# Patient Record
Sex: Male | Born: 1990 | Race: Black or African American | Hispanic: No | Marital: Single | Smoking: Never smoker
Health system: Southern US, Community
[De-identification: ages and names within clinical notes are randomized; demographics above are authoritative.]

## PROBLEM LIST (undated history)

## (undated) DIAGNOSIS — E669 Obesity, unspecified: Secondary | ICD-10-CM

## (undated) DIAGNOSIS — E782 Mixed hyperlipidemia: Secondary | ICD-10-CM

## (undated) DIAGNOSIS — E1100 Type 2 diabetes mellitus with hyperosmolarity without nonketotic hyperglycemic-hyperosmolar coma (NKHHC): Secondary | ICD-10-CM

## (undated) DIAGNOSIS — E781 Pure hyperglyceridemia: Secondary | ICD-10-CM

## (undated) DIAGNOSIS — R030 Elevated blood-pressure reading, without diagnosis of hypertension: Secondary | ICD-10-CM

## (undated) DIAGNOSIS — E66811 Obesity, class 1: Secondary | ICD-10-CM

## (undated) DIAGNOSIS — R7303 Prediabetes: Secondary | ICD-10-CM

## (undated) DIAGNOSIS — R569 Unspecified convulsions: Secondary | ICD-10-CM

## (undated) HISTORY — DX: Obesity, class 1: E66.811

## (undated) HISTORY — DX: Mixed hyperlipidemia: E78.2

## (undated) HISTORY — DX: Elevated blood-pressure reading, without diagnosis of hypertension: R03.0

## (undated) HISTORY — DX: Prediabetes: R73.03

## (undated) HISTORY — DX: Type 2 diabetes mellitus with hyperosmolarity without nonketotic hyperglycemic-hyperosmolar coma (NKHHC): E11.00

## (undated) HISTORY — DX: Pure hyperglyceridemia: E78.1

## (undated) HISTORY — DX: Obesity, unspecified: E66.9

---

## 2015-03-28 ENCOUNTER — Encounter: Payer: Self-pay | Admitting: Family Medicine

## 2015-03-28 ENCOUNTER — Ambulatory Visit (INDEPENDENT_AMBULATORY_CARE_PROVIDER_SITE_OTHER): Payer: 59 | Admitting: Family Medicine

## 2015-03-28 VITALS — BP 128/80 | HR 86 | Temp 98.4°F | Resp 18 | Ht 72.0 in | Wt 259.1 lb

## 2015-03-28 DIAGNOSIS — R7303 Prediabetes: Secondary | ICD-10-CM

## 2015-03-28 DIAGNOSIS — E669 Obesity, unspecified: Secondary | ICD-10-CM

## 2015-03-28 DIAGNOSIS — E781 Pure hyperglyceridemia: Secondary | ICD-10-CM | POA: Insufficient documentation

## 2015-03-28 DIAGNOSIS — R030 Elevated blood-pressure reading, without diagnosis of hypertension: Secondary | ICD-10-CM | POA: Insufficient documentation

## 2015-03-28 HISTORY — DX: Prediabetes: R73.03
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
## 2015-07-22 ENCOUNTER — Emergency Department
Admission: EM | Admit: 2015-07-22 | Discharge: 2015-07-22 | Disposition: A | Discharge status: Home / Self Care | Payer: 59 | Attending: Emergency Medicine | Admitting: Emergency Medicine

## 2015-07-22 ENCOUNTER — Emergency Department: Payer: 59

## 2015-07-22 ENCOUNTER — Encounter: Payer: Self-pay | Admitting: Emergency Medicine

## 2015-07-22 DIAGNOSIS — R569 Unspecified convulsions: Secondary | ICD-10-CM | POA: Diagnosis present

## 2015-07-22 DIAGNOSIS — R4182 Altered mental status, unspecified: Secondary | ICD-10-CM | POA: Diagnosis not present

## 2015-07-22 LAB — URINE DRUG SCREEN, QUALITATIVE (ARMC ONLY)
AMPHETAMINES, UR SCREEN: NOT DETECTED
Barbiturates, Ur Screen: NOT DETECTED
Benzodiazepine, Ur Scrn: NOT DETECTED
COCAINE METABOLITE, UR ~~LOC~~: NOT DETECTED
Cannabinoid 50 Ng, Ur ~~LOC~~: NOT DETECTED
MDMA (ECSTASY) UR SCREEN: NOT DETECTED
METHADONE SCREEN, URINE: NOT DETECTED
OPIATE, UR SCREEN: NOT DETECTED
Phencyclidine (PCP) Ur S: NOT DETECTED
Tricyclic, Ur Screen: NOT DETECTED

## 2015-07-22 LAB — CBC WITH DIFFERENTIAL/PLATELET
Basophils Absolute: 0 10*3/uL (ref 0–0.1)
Basophils Relative: 0 %
Eosinophils Absolute: 0.1 10*3/uL (ref 0–0.7)
Eosinophils Relative: 2 %
HEMATOCRIT: 48.9 % (ref 40.0–52.0)
HEMOGLOBIN: 16.1 g/dL (ref 13.0–18.0)
LYMPHS ABS: 1.6 10*3/uL (ref 1.0–3.6)
LYMPHS PCT: 25 %
MCH: 27.5 pg (ref 26.0–34.0)
MCHC: 32.9 g/dL (ref 32.0–36.0)
MCV: 83.5 fL (ref 80.0–100.0)
Monocytes Absolute: 0.5 10*3/uL (ref 0.2–1.0)
Monocytes Relative: 8 %
NEUTROS ABS: 4.1 10*3/uL (ref 1.4–6.5)
NEUTROS PCT: 65 %
Platelets: 176 10*3/uL (ref 150–440)
RBC: 5.85 MIL/uL (ref 4.40–5.90)
RDW: 13.7 % (ref 11.5–14.5)
WBC: 6.4 10*3/uL (ref 3.8–10.6)

## 2015-07-22 LAB — URINALYSIS COMPLETE WITH MICROSCOPIC (ARMC ONLY)
BILIRUBIN URINE: NEGATIVE
GLUCOSE, UA: NEGATIVE mg/dL
Hgb urine dipstick: NEGATIVE
Ketones, ur: NEGATIVE mg/dL
Leukocytes, UA: NEGATIVE
Nitrite: NEGATIVE
Protein, ur: 30 mg/dL — AB
Specific Gravity, Urine: 1.016 (ref 1.005–1.030)
pH: 5 (ref 5.0–8.0)

## 2015-07-22 LAB — COMPREHENSIVE METABOLIC PANEL
ALK PHOS: 63 U/L (ref 38–126)
ALT: 45 U/L (ref 17–63)
AST: 32 U/L (ref 15–41)
Albumin: 4.5 g/dL (ref 3.5–5.0)
Anion gap: 7 (ref 5–15)
BUN: 15 mg/dL (ref 6–20)
CALCIUM: 9.9 mg/dL (ref 8.9–10.3)
CO2: 26 mmol/L (ref 22–32)
CREATININE: 1.07 mg/dL (ref 0.61–1.24)
Chloride: 109 mmol/L (ref 101–111)
Glucose, Bld: 105 mg/dL — ABNORMAL HIGH (ref 65–99)
Potassium: 4 mmol/L (ref 3.5–5.1)
SODIUM: 142 mmol/L (ref 135–145)
Total Protein: 7.5 g/dL (ref 6.5–8.1)

## 2015-07-22 LAB — TROPONIN I: Troponin I: <0.03 ng/mL (ref <0.031)

## 2015-07-22 LAB — ETHANOL: Alcohol, Ethyl (B): 9 mg/dL — ABNORMAL HIGH (ref <5)

## 2015-07-22 MED ORDER — SODIUM CHLORIDE 0.9 % IV SOLN
1000.0000 mL | Freq: Once | INTRAVENOUS | Status: AC
  Administered 2015-07-22: 1000 mL via INTRAVENOUS

## 2015-07-22 MED ORDER — LEVETIRACETAM 500 MG PO TABS
500.0000 mg | ORAL_TABLET | Freq: Once | ORAL | Status: AC
  Administered 2015-07-22: 500 mg via ORAL
  Filled 2015-07-22: qty 1

## 2015-07-22 MED ORDER — LEVETIRACETAM 500 MG PO TABS: 500.0000 mg | ORAL_TABLET | Freq: Two times a day (BID) | ORAL | Status: DC

## 2015-07-22 MED ORDER — LORAZEPAM 2 MG/ML IJ SOLN
1.0000 mg | Freq: Once | INTRAMUSCULAR | Status: AC
  Administered 2015-07-22: 1 mg via INTRAVENOUS
  Filled 2015-07-22: qty 1
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
## 2015-07-22 NOTE — Discharge Instructions (Signed)

## 2015-07-22 NOTE — ED Notes (Signed)
[facility].  EMS was called.  Upon arrival to ED, patient calm and cooperative.  Patient drowsy, oriented to person and place, but not situation.  VSS.

## 2015-07-23 ENCOUNTER — Ambulatory Visit: Payer: 59 | Admitting: Family Medicine

## 2015-07-25 DIAGNOSIS — R569 Unspecified convulsions: Secondary | ICD-10-CM | POA: Insufficient documentation

## 2015-07-25 DIAGNOSIS — G40909 Epilepsy, unspecified, not intractable, without status epilepticus: Secondary | ICD-10-CM | POA: Insufficient documentation

## 2015-07-26 ENCOUNTER — Ambulatory Visit: Payer: 59 | Admitting: Family Medicine

## 2015-07-30 ENCOUNTER — Ambulatory Visit (INDEPENDENT_AMBULATORY_CARE_PROVIDER_SITE_OTHER): Payer: 59 | Admitting: Family Medicine

## 2015-07-30 ENCOUNTER — Encounter: Payer: Self-pay | Admitting: Family Medicine

## 2015-07-30 VITALS — BP 132/88 | HR 100 | Temp 97.7°F | Resp 16 | Wt 249.7 lb

## 2015-07-30 DIAGNOSIS — R569 Unspecified convulsions: Secondary | ICD-10-CM | POA: Diagnosis not present
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
## 2015-07-30 NOTE — Patient Instructions (Signed)
[facility]

## 2015-08-01 ENCOUNTER — Other Ambulatory Visit: Payer: Self-pay | Admitting: Neurology

## 2015-08-01 DIAGNOSIS — R569 Unspecified convulsions: Secondary | ICD-10-CM

## 2015-08-26 ENCOUNTER — Encounter: Payer: 59 | Admitting: Family Medicine

## 2015-09-30 ENCOUNTER — Ambulatory Visit
Admission: RE | Admit: 2015-09-30 | Discharge: 2015-09-30 | Disposition: A | Discharge status: Home / Self Care | Payer: 59 | Source: Ambulatory Visit | Attending: Neurology | Admitting: Neurology

## 2015-09-30 DIAGNOSIS — R569 Unspecified convulsions: Secondary | ICD-10-CM | POA: Diagnosis present

## 2015-09-30 MED ORDER — GADOBENATE DIMEGLUMINE 529 MG/ML IV SOLN
20.0000 mL | Freq: Once | INTRAVENOUS | Status: AC | PRN
  Administered 2015-09-30: 20 mL via INTRAVENOUS

## 2016-03-30 ENCOUNTER — Emergency Department
Admission: EM | Admit: 2016-03-30 | Discharge: 2016-03-31 | Disposition: A | Discharge status: Home / Self Care | Payer: 59 | Attending: Student in an Organized Health Care Education/Training Program | Admitting: Student in an Organized Health Care Education/Training Program

## 2016-03-30 ENCOUNTER — Emergency Department: Payer: 59

## 2016-03-30 DIAGNOSIS — Y92410 Unspecified street and highway as the place of occurrence of the external cause: Secondary | ICD-10-CM | POA: Diagnosis not present

## 2016-03-30 DIAGNOSIS — Y999 Unspecified external cause status: Secondary | ICD-10-CM | POA: Insufficient documentation

## 2016-03-30 DIAGNOSIS — Z79899 Other long term (current) drug therapy: Secondary | ICD-10-CM | POA: Insufficient documentation

## 2016-03-30 DIAGNOSIS — Y9389 Activity, other specified: Secondary | ICD-10-CM | POA: Diagnosis not present

## 2016-03-30 DIAGNOSIS — G40909 Epilepsy, unspecified, not intractable, without status epilepticus: Secondary | ICD-10-CM | POA: Insufficient documentation

## 2016-03-30 DIAGNOSIS — R569 Unspecified convulsions: Secondary | ICD-10-CM

## 2016-03-30 DIAGNOSIS — Z041 Encounter for examination and observation following transport accident: Secondary | ICD-10-CM | POA: Diagnosis present

## 2016-03-30 LAB — COMPREHENSIVE METABOLIC PANEL
ALK PHOS: 60 U/L (ref 38–126)
ALT: 60 U/L (ref 17–63)
AST: 46 U/L — AB (ref 15–41)
Albumin: 4.4 g/dL (ref 3.5–5.0)
Anion gap: 9 (ref 5–15)
BILIRUBIN TOTAL: 0.6 mg/dL (ref 0.3–1.2)
BUN: 13 mg/dL (ref 6–20)
CO2: 24 mmol/L (ref 22–32)
CREATININE: 1.12 mg/dL (ref 0.61–1.24)
Calcium: 10 mg/dL (ref 8.9–10.3)
Chloride: 107 mmol/L (ref 101–111)
GFR calc Af Amer: >60 mL/min (ref >60)
Glucose, Bld: 120 mg/dL — ABNORMAL HIGH (ref 65–99)
Potassium: 3.8 mmol/L (ref 3.5–5.1)
Sodium: 140 mmol/L (ref 135–145)
Total Protein: 7.4 g/dL (ref 6.5–8.1)

## 2016-03-30 LAB — RAPID HIV SCREEN (HIV 1/2 AB+AG)
HIV 1/2 Antibodies: NONREACTIVE
HIV-1 P24 ANTIGEN - HIV24: NONREACTIVE

## 2016-03-30 LAB — CBC
HEMATOCRIT: 48.5 % (ref 40.0–52.0)
Hemoglobin: 16.2 g/dL (ref 13.0–18.0)
MCH: 27.5 pg (ref 26.0–34.0)
MCHC: 33.3 g/dL (ref 32.0–36.0)
MCV: 82.7 fL (ref 80.0–100.0)
Platelets: 146 10*3/uL — ABNORMAL LOW (ref 150–440)
RBC: 5.87 MIL/uL (ref 4.40–5.90)
RDW: 13.7 % (ref 11.5–14.5)
WBC: 11.7 10*3/uL — AB (ref 3.8–10.6)

## 2016-03-30 MED ORDER — SODIUM CHLORIDE 0.9 % IV BOLUS (SEPSIS)
1000.0000 mL | Freq: Once | INTRAVENOUS | Status: AC
Start: 2016-03-30 — End: 2016-03-30
  Administered 2016-03-30: 1000 mL via INTRAVENOUS

## 2016-03-30 MED ORDER — LEVETIRACETAM 500 MG/5ML IV SOLN
1500.0000 mg | Freq: Once | INTRAVENOUS | Status: AC
  Administered 2016-03-30: 1500 mg via INTRAVENOUS
  Filled 2016-03-30: qty 15

## 2016-03-30 MED ORDER — GADOBENATE DIMEGLUMINE 529 MG/ML IV SOLN
20.0000 mL | Freq: Once | INTRAVENOUS | Status: AC | PRN
  Administered 2016-03-30: 20 mL via INTRAVENOUS
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
## 2019-05-29 ENCOUNTER — Other Ambulatory Visit: Payer: Self-pay

## 2019-05-29 DIAGNOSIS — Z20822 Contact with and (suspected) exposure to covid-19: Secondary | ICD-10-CM

## 2019-05-30 LAB — NOVEL CORONAVIRUS, NAA: SARS-CoV-2, NAA: DETECTED — AB

## 2019-06-07 ENCOUNTER — Other Ambulatory Visit: Payer: Self-pay | Admitting: *Deleted

## 2019-06-07 DIAGNOSIS — Z20822 Contact with and (suspected) exposure to covid-19: Secondary | ICD-10-CM

## 2019-06-09 LAB — NOVEL CORONAVIRUS, NAA: SARS-CoV-2, NAA: NOT DETECTED

## 2019-07-27 ENCOUNTER — Encounter: Payer: Self-pay | Admitting: Emergency Medicine

## 2019-07-27 ENCOUNTER — Emergency Department: Payer: Self-pay

## 2019-07-27 ENCOUNTER — Emergency Department
Admission: EM | Admit: 2019-07-27 | Discharge: 2019-07-27 | Disposition: A | Discharge status: Home / Self Care | Payer: Self-pay | Attending: Emergency Medicine | Admitting: Emergency Medicine

## 2019-07-27 ENCOUNTER — Other Ambulatory Visit: Payer: Self-pay

## 2019-07-27 DIAGNOSIS — Z79899 Other long term (current) drug therapy: Secondary | ICD-10-CM | POA: Insufficient documentation

## 2019-07-27 DIAGNOSIS — R569 Unspecified convulsions: Secondary | ICD-10-CM | POA: Insufficient documentation

## 2019-07-27 LAB — HEPATIC FUNCTION PANEL
ALT: 69 U/L — ABNORMAL HIGH (ref 0–44)
AST: 53 U/L — ABNORMAL HIGH (ref 15–41)
Albumin: 4.3 g/dL (ref 3.5–5.0)
Alkaline Phosphatase: 50 U/L (ref 38–126)
Bilirubin, Direct: <0.1 mg/dL (ref 0.0–0.2)
Total Bilirubin: 0.6 mg/dL (ref 0.3–1.2)
Total Protein: 7 g/dL (ref 6.5–8.1)

## 2019-07-27 LAB — CBC WITH DIFFERENTIAL/PLATELET
Abs Immature Granulocytes: 0.02 10*3/uL (ref 0.00–0.07)
Basophils Absolute: 0 10*3/uL (ref 0.0–0.1)
Basophils Relative: 0 %
Eosinophils Absolute: 0.2 10*3/uL (ref 0.0–0.5)
Eosinophils Relative: 2 %
HCT: 44.3 % (ref 39.0–52.0)
Hemoglobin: 14.5 g/dL (ref 13.0–17.0)
Immature Granulocytes: 0 %
Lymphocytes Relative: 41 %
Lymphs Abs: 2.8 10*3/uL (ref 0.7–4.0)
MCH: 27.4 pg (ref 26.0–34.0)
MCHC: 32.7 g/dL (ref 30.0–36.0)
MCV: 83.7 fL (ref 80.0–100.0)
Monocytes Absolute: 0.5 10*3/uL (ref 0.1–1.0)
Monocytes Relative: 7 %
Neutro Abs: 3.4 10*3/uL (ref 1.7–7.7)
Neutrophils Relative %: 50 %
Platelets: 177 10*3/uL (ref 150–400)
RBC: 5.29 MIL/uL (ref 4.22–5.81)
RDW: 13.8 % (ref 11.5–15.5)
WBC: 6.9 10*3/uL (ref 4.0–10.5)
nRBC: 0 % (ref 0.0–0.2)

## 2019-07-27 LAB — MAGNESIUM: Magnesium: 2.1 mg/dL (ref 1.7–2.4)

## 2019-07-27 LAB — BASIC METABOLIC PANEL
Anion gap: 12 (ref 5–15)
BUN: 17 mg/dL (ref 6–20)
CO2: 21 mmol/L — ABNORMAL LOW (ref 22–32)
Calcium: 9.4 mg/dL (ref 8.9–10.3)
Chloride: 108 mmol/L (ref 98–111)
Creatinine, Ser: 1.21 mg/dL (ref 0.61–1.24)
GFR calc Af Amer: >60 mL/min (ref >60)
GFR calc non Af Amer: >60 mL/min (ref >60)
Glucose, Bld: 194 mg/dL — ABNORMAL HIGH (ref 70–99)
Potassium: 4.2 mmol/L (ref 3.5–5.1)
Sodium: 141 mmol/L (ref 135–145)

## 2019-07-27 MED ORDER — LEVETIRACETAM IN NACL 1000 MG/100ML IV SOLN
1000.0000 mg | Freq: Once | INTRAVENOUS | Status: DC
  Filled 2019-07-27: qty 100

## 2019-07-27 MED ORDER — SODIUM CHLORIDE 0.9 % IV BOLUS
1000.0000 mL | Freq: Once | INTRAVENOUS | Status: AC
  Administered 2019-07-27: 01:00:00 1000 mL via INTRAVENOUS

## 2019-07-27 NOTE — Discharge Instructions (Signed)
Your liver enzymes and your sugar was slightly elevated.  She follows up with your primary doctor.  Make sure you continue take your Keppra.  Follow-up with your neurologist and let him or her know what happened today.  Return to the ER for recurrent seizures, shortness of breath, fevers or any other concerns
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
## 2019-07-27 NOTE — ED Notes (Signed)
See paper mar for med admin

## 2019-07-27 NOTE — ED Provider Notes (Signed)
[facility] Emergency Department Provider Note  ____________________________________________   First MD Initiated Contact with Patient 07/27/19 0003     (approximate)  I have reviewed the triage vital signs and the nursing notes.   HISTORY  Chief Complaint Seizure  HPI Benjamin Richardson is a 28 y.o. male with seizures who presents for concern for seizure.  Patient has a history of seizures.  Last seizure was in 2017.  He is taking Keppra 500 twice a day.  Had an EEG that was negative.  Patient felt like his seizures were due to stress however at this time he was still continued on his Keppra.  He was seen in November 2020 and everything about decreasing his Keppra if he remains seizure-free.  Patient was at work today.  He states he felt fine prior to going to work.  He does not remember anything else but then waking up.  Per staff he had a seizure.  EMS was not able to clarify how long it lasted or what it looked like.  The staff did say that he hit his head.  He was postictal with EMS for a little bit and seemed little confused.  There is no urinary incontinence or tongue biting.  Patient was transferred to the ER for further evaluation.  The seizure occurred one time, better on its own, nothing unclear what brought it on, nothing made it worse.  He currently says he feels at his baseline self.  Patient saturations noted to be 93% although he denies any shortness of breath or coughing.  Patient was coronavirus positive back in September.  Denies any risk factors for pulmonary embolism. Pt states he has missed a few doses of keppra in the past week.           Past Medical History:  Diagnosis Date   Elevated blood pressure reading without diagnosis of hypertension    Isolated hypertriglyceridemia    Obesity, Class I, BMI 30-34.9    Pre-diabetes     Patient Active Problem List   Diagnosis Date Noted   Seizure (HCC) 07/25/2015   Blood pressure elevated  without history of HTN 03/28/2015   Hypertriglyceridemia 03/28/2015   Obesity, Class II, BMI 35-39.9 03/28/2015   Borderline diabetes 03/28/2015    History reviewed. No pertinent surgical history.  Prior to Admission medications   Medication Sig Start Date End Date Taking? Authorizing Provider  levETIRAcetam (KEPPRA) 500 MG tablet Take 1 tablet (500 mg total) by mouth 2 (two) times daily. 07/22/15   Emily Filbert, MD    Allergies Patient has no known allergies.  Family History  Problem Relation Age of Onset   Diabetes Mother     Social History Social History   Tobacco Use   Smoking status: Never Smoker   Smokeless tobacco: Never Used  Substance Use Topics   Alcohol use: Yes    Alcohol/week: 0.0 standard drinks    Comment: ocassionally   Drug use: No      Review of Systems Constitutional: No fever/chills Eyes: No visual changes. ENT: No sore throat. Cardiovascular: Denies chest pain. Respiratory: Denies shortness of breath. Gastrointestinal: No abdominal pain.  No nausea, no vomiting.  No diarrhea.  No constipation. Genitourinary: Negative for dysuria. Musculoskeletal: Negative for back pain. Skin: Negative for rash. Neurological: Negative for headaches, focal weakness or numbness.  Positive seizure All other ROS negative ____________________________________________   PHYSICAL EXAM:  VITAL SIGNS: ED Triage Vitals  Enc Vitals Group  BP 07/27/19 0008 104/71     Pulse Rate 07/27/19 0008 (!) 110     Resp 07/27/19 0008 18     Temp 07/27/19 0008 98.2 F (36.8 C)     Temp Source 07/27/19 0008 Oral     SpO2 07/27/19 0005 90 %     Weight 07/27/19 0008 255 lb (115.7 kg)     Height 07/27/19 0008  (1.88 m)     Head Circumference --      Peak Flow --      Pain Score 07/27/19 0008 0     Pain Loc --      Pain Edu? --      Excl. in GC? --     Constitutional: Alert and oriented somewhat slow to answer questions but otherwise answering them  appropriately.  Eyes: Conjunctivae are normal. EOMI. Head: Atraumatic. Nose: No congestion/rhinnorhea. Mouth/Throat: Mucous membranes are moist.   Neck: No stridor. Trachea Midline. FROM Cardiovascular: Tachycardic, regular rhythm. Grossly normal heart sounds.  Good peripheral circulation. Respiratory: Normal respiratory effort.  No retractions. Lungs CTAB. Gastrointestinal: Soft and nontender. No distention. No abdominal bruits.  Musculoskeletal: No lower extremity tenderness nor edema.  No joint effusions. Neurologic:  Normal speech and language. No gross focal neurologic deficits are appreciated.  Good strength in arms and legs. Skin:  Skin is warm, dry and intact. No rash noted. Psychiatric: Mood and affect are normal. Speech and behavior are normal. GU: Deferred   ____________________________________________   LABS (all labs ordered are listed, but only abnormal results are displayed)  Labs Reviewed  BASIC METABOLIC PANEL - Abnormal; Notable for the following components:      Result Value   CO2 21 (*)    Glucose, Bld 194 (*)    All other components within normal limits  HEPATIC FUNCTION PANEL - Abnormal; Notable for the following components:   AST 53 (*)    ALT 69 (*)    All other components within normal limits  CBC WITH DIFFERENTIAL/PLATELET  MAGNESIUM  URINALYSIS, ROUTINE W REFLEX MICROSCOPIC  LEVETIRACETAM LEVEL   ____________________________________________   ED ECG REPORT I, Concha Se, the attending physician, personally viewed and interpreted this ECG.  EKG is normal sinus rate 92 some ST elevation that looks more consistent with early repole, no reciprocal depressions, T wave inversion in lead III, normal intervals.  Looks pretty similar to prior EKG ____________________________________________  RADIOLOGY Vela Prose, personally viewed and evaluated these images (plain radiographs) as part of my medical decision making, as well as reviewing the written  report by the radiologist.  ED MD interpretation: No pneumonia  Official radiology report(s): DG Chest 1 View  Result Date: 07/27/2019 CLINICAL DATA:  Shortness of breath. Seizure. EXAM: CHEST  1 VIEW COMPARISON:  Radiograph 03/31/2016 FINDINGS: Low lung volumes. Bronchovascular crowding related to low lung volumes. Upper normal heart size likely accentuated by technique. No confluent airspace disease, pleural effusion or pneumothorax. No acute osseous abnormalities are seen. IMPRESSION: 1. Low lung volumes with bronchovascular crowding. 2. Upper normal heart size likely accentuated by technique. Electronically Signed   By: Narda Rutherford M.D.   On: 07/27/2019 00:57   CT Head Wo Contrast  Result Date: 07/27/2019 CLINICAL DATA:  Head trauma, minor, GCS>=13, high clinical risk, initial exam. Seizure, patient with history of seizures. EXAM: CT HEAD WITHOUT CONTRAST TECHNIQUE: Contiguous axial images were obtained from the base of the skull through the vertex without intravenous contrast. COMPARISON:  Head CT and brain  MRI 03/31/2016 FINDINGS: Brain: No intracranial hemorrhage, mass effect, or midline shift. Again seen low lying cerebellar tonsils, previously characterized on MRI. Again seen prominent perivascular space in the left basal ganglia, unchanged. No hydrocephalus. The basilar cisterns are patent. No evidence of territorial infarct or acute ischemia. No extra-axial or intracranial fluid collection. Vascular: No hyperdense vessel or unexpected calcification. Skull: No fracture or focal lesion. Sinuses/Orbits: Paranasal sinuses and mastoid air cells are clear. The visualized orbits are unremarkable. Other: None. IMPRESSION: 1. No acute intracranial abnormality. No skull fracture. 2. Low lying cerebellar tonsils again seen, previously characterized on MRI. Electronically Signed   By: Keith Rake M.D.   On: 07/27/2019 00:51     ____________________________________________   PROCEDURES  Procedure(s) performed (including Critical Care):  Procedures   ____________________________________________   INITIAL IMPRESSION / ASSESSMENT AND PLAN / ED COURSE  Dalonte Hardage was evaluated in Emergency Department on 07/27/2019 for the symptoms described in the history of present illness. He was evaluated in the context of the global COVID-19 pandemic, which necessitated consideration that the patient might be at risk for infection with the SARS-CoV-2 virus that causes COVID-19. Institutional protocols and algorithms that pertain to the evaluation of patients at risk for COVID-19 are in a state of rapid change based on information released by regulatory bodies including the CDC and federal and state organizations. These policies and algorithms were followed during the patient's care in the ED.    Patient is a well-appearing 28 year old who presents with seizure.  Patient does report that he is missed maybe 1 or 2 doses of his Keppra this week.  Possibly secondary to medication noncompliance.  Will get labs to evaluate for Electra abnormalities, AKI.  Given he did hit his head will get CT head evaluate for intracranial hemorrhage.  Denies any other infectious symptoms at this time.  Sats are little bit low at 93% although he denies any shortness of breath.  Will get x-ray to make sure he can aspirate.  He denies any risk factors for pulmonary embolism so I felt that low risk.  Could be just be residual from his recent Covid.Denies any symptoms of UTI.   Patient CT head was negative, x-ray was negative.  Labs are reassuring.  Slightly elevated LFTs which patient can follow-up with outpatient.  Glucose also slightly elevated.  Patient can follow these up with his PCP.  Patient given 1 g of Keppra due to concern for possible noncompliance.  Keppra levels pending.  Patient's been seizure-free for over 3 hours while the emergency  room.  Patient ambulated with sats around 94% and denied any shortness of breath. Hr now of 90.   I think at this time of low suspicion for PE given no risk factors and more light likely this is from his recent Covid.  Patient feels comfortable with being discharged home and will follow up with neurology.         ____________________________________________   FINAL CLINICAL IMPRESSION(S) / ED DIAGNOSES   Final diagnoses:  Seizure ([place])      MEDICATIONS GIVEN DURING THIS VISIT:  Medications  levETIRAcetam (KEPPRA) IVPB 1000 mg/100 mL premix (has no administration in time range)  sodium chloride 0.9 % bolus 1,000 mL (1,000 mLs Intravenous New Bag/Given 07/27/19 0100)     ED Discharge Orders    None       Note:  This document was prepared using Dragon voice recognition software and may include unintentional dictation errors.   Vanessa [place],  MD 07/27/19 [id number]

## 2019-07-28 LAB — LEVETIRACETAM LEVEL: Levetiracetam Lvl: 6.5 ug/mL — ABNORMAL LOW (ref 10.0–40.0)

## 2020-10-07 ENCOUNTER — Encounter: Payer: Self-pay | Admitting: Emergency Medicine

## 2020-10-07 DIAGNOSIS — Z79899 Other long term (current) drug therapy: Secondary | ICD-10-CM

## 2020-10-07 DIAGNOSIS — Z20822 Contact with and (suspected) exposure to covid-19: Secondary | ICD-10-CM | POA: Diagnosis present

## 2020-10-07 DIAGNOSIS — Z833 Family history of diabetes mellitus: Secondary | ICD-10-CM

## 2020-10-07 DIAGNOSIS — E870 Hyperosmolality and hypernatremia: Secondary | ICD-10-CM | POA: Diagnosis not present

## 2020-10-07 DIAGNOSIS — G40909 Epilepsy, unspecified, not intractable, without status epilepticus: Secondary | ICD-10-CM | POA: Diagnosis present

## 2020-10-07 DIAGNOSIS — I10 Essential (primary) hypertension: Secondary | ICD-10-CM | POA: Diagnosis present

## 2020-10-07 DIAGNOSIS — E878 Other disorders of electrolyte and fluid balance, not elsewhere classified: Secondary | ICD-10-CM | POA: Diagnosis present

## 2020-10-07 DIAGNOSIS — E860 Dehydration: Secondary | ICD-10-CM | POA: Diagnosis not present

## 2020-10-07 DIAGNOSIS — N179 Acute kidney failure, unspecified: Secondary | ICD-10-CM | POA: Diagnosis present

## 2020-10-07 DIAGNOSIS — E1110 Type 2 diabetes mellitus with ketoacidosis without coma: Principal | ICD-10-CM | POA: Diagnosis present

## 2020-10-07 DIAGNOSIS — E876 Hypokalemia: Secondary | ICD-10-CM | POA: Diagnosis not present

## 2020-10-07 LAB — CBG MONITORING, ED: Glucose-Capillary: >600 mg/dL (ref 70–99)

## 2020-10-07 MED ORDER — SODIUM CHLORIDE 0.9 % IV BOLUS
1000.0000 mL | Freq: Once | INTRAVENOUS | Status: AC
  Administered 2020-10-07: 1000 mL via INTRAVENOUS

## 2020-10-07 NOTE — ED Notes (Signed)
Ptb has completed NS before arrival to ED, administered by EMS.   EMS also reported oral temp of 100.81F.

## 2020-10-07 NOTE — ED Triage Notes (Signed)
Pt arrived via EMS from work where his manager called out due to pts increased weakness. Pt c/o general fatigue, increased urination and increased thirst. Pts CBG with EMS 405. No known DM hx.

## 2020-10-08 ENCOUNTER — Other Ambulatory Visit: Payer: Self-pay

## 2020-10-08 ENCOUNTER — Inpatient Hospital Stay
Admission: EM | Admit: 2020-10-08 | Discharge: 2020-10-10 | DRG: 638 | Disposition: A | Discharge status: Home / Self Care | Payer: Managed Care, Other (non HMO) | Attending: Internal Medicine | Admitting: Internal Medicine

## 2020-10-08 DIAGNOSIS — E1010 Type 1 diabetes mellitus with ketoacidosis without coma: Secondary | ICD-10-CM | POA: Diagnosis not present

## 2020-10-08 DIAGNOSIS — R739 Hyperglycemia, unspecified: Secondary | ICD-10-CM

## 2020-10-08 DIAGNOSIS — E1110 Type 2 diabetes mellitus with ketoacidosis without coma: Secondary | ICD-10-CM | POA: Diagnosis present

## 2020-10-08 DIAGNOSIS — Z79899 Other long term (current) drug therapy: Secondary | ICD-10-CM | POA: Diagnosis not present

## 2020-10-08 DIAGNOSIS — E870 Hyperosmolality and hypernatremia: Secondary | ICD-10-CM | POA: Diagnosis not present

## 2020-10-08 DIAGNOSIS — E860 Dehydration: Secondary | ICD-10-CM

## 2020-10-08 DIAGNOSIS — I10 Essential (primary) hypertension: Secondary | ICD-10-CM | POA: Diagnosis present

## 2020-10-08 DIAGNOSIS — E876 Hypokalemia: Secondary | ICD-10-CM | POA: Diagnosis not present

## 2020-10-08 DIAGNOSIS — N179 Acute kidney failure, unspecified: Secondary | ICD-10-CM | POA: Diagnosis present

## 2020-10-08 DIAGNOSIS — G40909 Epilepsy, unspecified, not intractable, without status epilepticus: Secondary | ICD-10-CM

## 2020-10-08 DIAGNOSIS — Z20822 Contact with and (suspected) exposure to covid-19: Secondary | ICD-10-CM | POA: Diagnosis present

## 2020-10-08 DIAGNOSIS — E878 Other disorders of electrolyte and fluid balance, not elsewhere classified: Secondary | ICD-10-CM | POA: Diagnosis present

## 2020-10-08 DIAGNOSIS — Z833 Family history of diabetes mellitus: Secondary | ICD-10-CM | POA: Diagnosis not present

## 2020-10-08 DIAGNOSIS — E1310 Other specified diabetes mellitus with ketoacidosis without coma: Secondary | ICD-10-CM | POA: Diagnosis not present

## 2020-10-08 LAB — URINALYSIS, COMPLETE (UACMP) WITH MICROSCOPIC
Bacteria, UA: NONE SEEN
Bilirubin Urine: NEGATIVE
Glucose, UA: >=500 mg/dL — AB
Ketones, ur: 20 mg/dL — AB
Leukocytes,Ua: NEGATIVE
Nitrite: NEGATIVE
Protein, ur: NEGATIVE mg/dL
Specific Gravity, Urine: 1.03 (ref 1.005–1.030)
Squamous Epithelial / LPF: NONE SEEN (ref 0–5)
WBC, UA: NONE SEEN WBC/hpf (ref 0–5)
pH: 5 (ref 5.0–8.0)

## 2020-10-08 LAB — CBC
HCT: 52.7 % — ABNORMAL HIGH (ref 39.0–52.0)
HCT: 50.5 % (ref 39.0–52.0)
Hemoglobin: 17 g/dL (ref 13.0–17.0)
Hemoglobin: 16.6 g/dL (ref 13.0–17.0)
MCH: 28 pg (ref 26.0–34.0)
MCH: 27.9 pg (ref 26.0–34.0)
MCHC: 32.3 g/dL (ref 30.0–36.0)
MCHC: 32.9 g/dL (ref 30.0–36.0)
MCV: 86.7 fL (ref 80.0–100.0)
MCV: 84.9 fL (ref 80.0–100.0)
Platelets: 192 10*3/uL (ref 150–400)
Platelets: 158 10*3/uL (ref 150–400)
RBC: 6.08 MIL/uL — ABNORMAL HIGH (ref 4.22–5.81)
RBC: 5.95 MIL/uL — ABNORMAL HIGH (ref 4.22–5.81)
RDW: 14.5 % (ref 11.5–15.5)
RDW: 13.9 % (ref 11.5–15.5)
WBC: 9.4 10*3/uL (ref 4.0–10.5)
WBC: 11.2 10*3/uL — ABNORMAL HIGH (ref 4.0–10.5)
nRBC: 0 % (ref 0.0–0.2)
nRBC: 0 % (ref 0.0–0.2)

## 2020-10-08 LAB — BASIC METABOLIC PANEL
Anion gap: 16 — ABNORMAL HIGH (ref 5–15)
Anion gap: 11 (ref 5–15)
Anion gap: 10 (ref 5–15)
Anion gap: 9 (ref 5–15)
Anion gap: 8 (ref 5–15)
BUN: 25 mg/dL — ABNORMAL HIGH (ref 6–20)
BUN: 21 mg/dL — ABNORMAL HIGH (ref 6–20)
BUN: 15 mg/dL (ref 6–20)
BUN: 17 mg/dL (ref 6–20)
BUN: 16 mg/dL (ref 6–20)
CO2: 19 mmol/L — ABNORMAL LOW (ref 22–32)
CO2: 25 mmol/L (ref 22–32)
CO2: 23 mmol/L (ref 22–32)
CO2: 22 mmol/L (ref 22–32)
CO2: 25 mmol/L (ref 22–32)
Calcium: 10 mg/dL (ref 8.9–10.3)
Calcium: 10.2 mg/dL (ref 8.9–10.3)
Calcium: 9.6 mg/dL (ref 8.9–10.3)
Calcium: 9.1 mg/dL (ref 8.9–10.3)
Calcium: 9.2 mg/dL (ref 8.9–10.3)
Chloride: 97 mmol/L — ABNORMAL LOW (ref 98–111)
Chloride: 114 mmol/L — ABNORMAL HIGH (ref 98–111)
Chloride: 113 mmol/L — ABNORMAL HIGH (ref 98–111)
Chloride: 109 mmol/L (ref 98–111)
Chloride: 106 mmol/L (ref 98–111)
Creatinine, Ser: 1.72 mg/dL — ABNORMAL HIGH (ref 0.61–1.24)
Creatinine, Ser: 1.48 mg/dL — ABNORMAL HIGH (ref 0.61–1.24)
Creatinine, Ser: 1.15 mg/dL (ref 0.61–1.24)
Creatinine, Ser: 1.23 mg/dL (ref 0.61–1.24)
Creatinine, Ser: 1.34 mg/dL — ABNORMAL HIGH (ref 0.61–1.24)
GFR, Estimated: 55 mL/min — ABNORMAL LOW (ref >60)
GFR, Estimated: >60 mL/min (ref >60)
GFR, Estimated: >60 mL/min (ref >60)
GFR, Estimated: >60 mL/min (ref >60)
GFR, Estimated: >60 mL/min (ref >60)
Glucose, Bld: 1320 mg/dL (ref 70–99)
Glucose, Bld: 477 mg/dL — ABNORMAL HIGH (ref 70–99)
Glucose, Bld: 239 mg/dL — ABNORMAL HIGH (ref 70–99)
Glucose, Bld: 292 mg/dL — ABNORMAL HIGH (ref 70–99)
Glucose, Bld: 261 mg/dL — ABNORMAL HIGH (ref 70–99)
Potassium: 5 mmol/L (ref 3.5–5.1)
Potassium: 3.5 mmol/L (ref 3.5–5.1)
Potassium: 3.8 mmol/L (ref 3.5–5.1)
Potassium: 3.9 mmol/L (ref 3.5–5.1)
Potassium: 3.8 mmol/L (ref 3.5–5.1)
Sodium: 132 mmol/L — ABNORMAL LOW (ref 135–145)
Sodium: 150 mmol/L — ABNORMAL HIGH (ref 135–145)
Sodium: 146 mmol/L — ABNORMAL HIGH (ref 135–145)
Sodium: 140 mmol/L (ref 135–145)
Sodium: 139 mmol/L (ref 135–145)

## 2020-10-08 LAB — BLOOD GAS, VENOUS
Acid-base deficit: 6.9 mmol/L — ABNORMAL HIGH (ref 0.0–2.0)
Acid-base deficit: 2.7 mmol/L — ABNORMAL HIGH (ref 0.0–2.0)
Bicarbonate: 20.6 mmol/L (ref 20.0–28.0)
Bicarbonate: 22.6 mmol/L (ref 20.0–28.0)
O2 Saturation: 73.5 %
O2 Saturation: 93.1 %
Patient temperature: 37
Patient temperature: 37
pCO2, Ven: 47 mmHg (ref 44.0–60.0)
pCO2, Ven: 40 mmHg — ABNORMAL LOW (ref 44.0–60.0)
pH, Ven: 7.25 (ref 7.250–7.430)
pH, Ven: 7.36 (ref 7.250–7.430)
pO2, Ven: 46 mmHg — ABNORMAL HIGH (ref 32.0–45.0)
pO2, Ven: 70 mmHg — ABNORMAL HIGH (ref 32.0–45.0)

## 2020-10-08 LAB — CBG MONITORING, ED
Glucose-Capillary: 280 mg/dL — ABNORMAL HIGH (ref 70–99)
Glucose-Capillary: 397 mg/dL — ABNORMAL HIGH (ref 70–99)
Glucose-Capillary: 410 mg/dL — ABNORMAL HIGH (ref 70–99)
Glucose-Capillary: 437 mg/dL — ABNORMAL HIGH (ref 70–99)
Glucose-Capillary: 526 mg/dL (ref 70–99)
Glucose-Capillary: 599 mg/dL (ref 70–99)
Glucose-Capillary: >600 mg/dL (ref 70–99)
Glucose-Capillary: >600 mg/dL (ref 70–99)

## 2020-10-08 LAB — BETA-HYDROXYBUTYRIC ACID
Beta-Hydroxybutyric Acid: 4.12 mmol/L — ABNORMAL HIGH (ref 0.05–0.27)
Beta-Hydroxybutyric Acid: 2.07 mmol/L — ABNORMAL HIGH (ref 0.05–0.27)

## 2020-10-08 LAB — GLUCOSE, CAPILLARY
Glucose-Capillary: 164 mg/dL — ABNORMAL HIGH (ref 70–99)
Glucose-Capillary: 239 mg/dL — ABNORMAL HIGH (ref 70–99)
Glucose-Capillary: 241 mg/dL — ABNORMAL HIGH (ref 70–99)

## 2020-10-08 LAB — SARS CORONAVIRUS 2 (TAT 6-24 HRS): SARS Coronavirus 2: NEGATIVE

## 2020-10-08 LAB — HIV ANTIBODY (ROUTINE TESTING W REFLEX): HIV Screen 4th Generation wRfx: NONREACTIVE

## 2020-10-08 MED ORDER — LACTATED RINGERS IV BOLUS
20.0000 mL/kg | Freq: Once | INTRAVENOUS | Status: AC
  Administered 2020-10-08: 2278 mL via INTRAVENOUS

## 2020-10-08 MED ORDER — DEXTROSE 50 % IV SOLN: 0.0000 mL | INTRAVENOUS | Status: DC | PRN

## 2020-10-08 MED ORDER — DEXTROSE IN LACTATED RINGERS 5 % IV SOLN: INTRAVENOUS | Status: DC

## 2020-10-08 MED ORDER — CHLORHEXIDINE GLUCONATE CLOTH 2 % EX PADS: 6.0000 | MEDICATED_PAD | Freq: Every day | CUTANEOUS | Status: DC

## 2020-10-08 MED ORDER — LACTATED RINGERS IV SOLN: INTRAVENOUS | Status: DC

## 2020-10-08 MED ORDER — DM-GUAIFENESIN ER 30-600 MG PO TB12: 1.0000 | ORAL_TABLET | Freq: Two times a day (BID) | ORAL | Status: DC | PRN

## 2020-10-08 MED ORDER — INSULIN ASPART 100 UNIT/ML ~~LOC~~ SOLN
0.0000 [IU] | Freq: Every day | SUBCUTANEOUS | Status: DC
  Administered 2020-10-08 – 2020-10-09 (×2): 2 [IU] via SUBCUTANEOUS
  Filled 2020-10-08 (×2): qty 1

## 2020-10-08 MED ORDER — INSULIN STARTER KIT- PEN NEEDLES (SPANISH)
1.0000 | Freq: Once | Status: AC
  Administered 2020-10-08: 1
  Filled 2020-10-08: qty 1

## 2020-10-08 MED ORDER — INSULIN GLARGINE 100 UNIT/ML ~~LOC~~ SOLN
25.0000 [IU] | Freq: Every day | SUBCUTANEOUS | Status: DC
  Administered 2020-10-08 – 2020-10-10 (×3): 25 [IU] via SUBCUTANEOUS
  Filled 2020-10-08 (×4): qty 0.25

## 2020-10-08 MED ORDER — INSULIN REGULAR(HUMAN) IN NACL 100-0.9 UT/100ML-% IV SOLN
INTRAVENOUS | Status: DC
  Administered 2020-10-08: 14 [IU]/h via INTRAVENOUS
  Administered 2020-10-08: 15 [IU]/h via INTRAVENOUS
  Filled 2020-10-08 (×2): qty 100

## 2020-10-08 MED ORDER — POTASSIUM CHLORIDE 10 MEQ/100ML IV SOLN
10.0000 meq | INTRAVENOUS | Status: AC
Start: 2020-10-08 — End: 2020-10-08
  Administered 2020-10-08 (×2): 10 meq via INTRAVENOUS
  Filled 2020-10-08: qty 100

## 2020-10-08 MED ORDER — POTASSIUM CHLORIDE CRYS ER 20 MEQ PO TBCR
40.0000 meq | EXTENDED_RELEASE_TABLET | Freq: Once | ORAL | Status: AC
  Administered 2020-10-08: 40 meq via ORAL
  Filled 2020-10-08: qty 2

## 2020-10-08 MED ORDER — POTASSIUM CHLORIDE 10 MEQ/100ML IV SOLN
INTRAVENOUS | Status: AC
  Filled 2020-10-08: qty 100

## 2020-10-08 MED ORDER — LEVETIRACETAM 500 MG PO TABS
500.0000 mg | ORAL_TABLET | Freq: Two times a day (BID) | ORAL | Status: DC
  Administered 2020-10-08 – 2020-10-10 (×5): 500 mg via ORAL
  Filled 2020-10-08 (×7): qty 1

## 2020-10-08 MED ORDER — INSULIN ASPART 100 UNIT/ML ~~LOC~~ SOLN
0.0000 [IU] | Freq: Three times a day (TID) | SUBCUTANEOUS | Status: DC
  Administered 2020-10-08: 5 [IU] via SUBCUTANEOUS
  Administered 2020-10-08: 8 [IU] via SUBCUTANEOUS
  Administered 2020-10-09: 13:00:00 5 [IU] via SUBCUTANEOUS
  Administered 2020-10-09: 3 [IU] via SUBCUTANEOUS
  Administered 2020-10-09: 5 [IU] via SUBCUTANEOUS
  Administered 2020-10-10: 09:00:00 3 [IU] via SUBCUTANEOUS
  Filled 2020-10-08 (×6): qty 1

## 2020-10-08 MED ORDER — POTASSIUM CHLORIDE 10 MEQ/100ML IV SOLN: 10.0000 meq | INTRAVENOUS | Status: DC

## 2020-10-08 MED ORDER — INSULIN REGULAR(HUMAN) IN NACL 100-0.9 UT/100ML-% IV SOLN: INTRAVENOUS | Status: DC

## 2020-10-08 MED ORDER — SODIUM CHLORIDE 0.9 % IV BOLUS
1000.0000 mL | Freq: Once | INTRAVENOUS | Status: AC
  Administered 2020-10-08: 1000 mL via INTRAVENOUS

## 2020-10-08 MED ORDER — LIVING WELL WITH DIABETES BOOK
Freq: Once | Status: AC
  Filled 2020-10-08: qty 1

## 2020-10-08 MED ORDER — SENNOSIDES-DOCUSATE SODIUM 8.6-50 MG PO TABS: 1.0000 | ORAL_TABLET | Freq: Every evening | ORAL | Status: DC | PRN

## 2020-10-08 MED ORDER — ENOXAPARIN SODIUM 60 MG/0.6ML ~~LOC~~ SOLN
0.5000 mg/kg | SUBCUTANEOUS | Status: DC
  Administered 2020-10-08 – 2020-10-09 (×2): 57.5 mg via SUBCUTANEOUS
  Filled 2020-10-08 (×4): qty 0.6

## 2020-10-08 MED ORDER — ENOXAPARIN SODIUM 40 MG/0.4ML ~~LOC~~ SOLN: 40.0000 mg | SUBCUTANEOUS | Status: DC

## 2020-10-08 MED ORDER — ACETAMINOPHEN 325 MG PO TABS: 650.0000 mg | ORAL_TABLET | Freq: Four times a day (QID) | ORAL | Status: DC | PRN

## 2020-10-08 MED ORDER — POTASSIUM CHLORIDE 10 MEQ/100ML IV SOLN
10.0000 meq | INTRAVENOUS | Status: AC
  Administered 2020-10-08 (×2): 10 meq via INTRAVENOUS
  Filled 2020-10-08 (×2): qty 100

## 2020-10-08 NOTE — ED Notes (Signed)
Pt resting comfortably watching tv, no complaints at this time.

## 2020-10-08 NOTE — ED Provider Notes (Signed)
[facility] Emergency Department Provider Note ____________________________________________   Event Date/Time   First MD Initiated Contact with Patient 10/08/20 0153     (approximate)  I have reviewed the triage vital signs and the nursing notes.  HISTORY  Chief Complaint Hyperglycemia   HPI Benjamin Richardson is a 30 y.o. malewho presents to the ED for evaluation of hyperglycemia and weakness.  Chart review indicates history of seizures on Keppra.  Obesity and prediabetes.  Patient presents to the ED with his father, who provides additional history, for evaluation of generalized weakness.  Patient was reportedly at work, at [employer], when he felt acute on subacute generalized weakness and his boss urged him to get evaluated in the hospital.  He reports subacute generalized weakness, polyuria and polydipsia.  Denies dysuria, fever, chest pain, abdominal pain, cough, emesis, syncope, diarrhea or dysuria.  Past Medical History:  Diagnosis Date  . Elevated blood pressure reading without diagnosis of hypertension   . Isolated hypertriglyceridemia   . Obesity, Class I, BMI 30-34.9   . Pre-diabetes     Patient Active Problem List   Diagnosis Date Noted  . Seizure (HCC) 07/25/2015  . Blood pressure elevated without history of HTN 03/28/2015  . Hypertriglyceridemia 03/28/2015  . Obesity, Class II, BMI 35-39.9 03/28/2015  . Borderline diabetes 03/28/2015    History reviewed. No pertinent surgical history.  Prior to Admission medications   Medication Sig Start Date End Date Taking? Authorizing Provider  levETIRAcetam (KEPPRA) 500 MG tablet Take 1 tablet (500 mg total) by mouth 2 (two) times daily. 07/22/15   Emily Filbert, MD    Allergies Patient has no known allergies.  Family History  Problem Relation Age of Onset  . Diabetes Mother     Social History Social History   Tobacco Use  . Smoking status: Never Smoker  . Smokeless tobacco: Never  Used  Substance Use Topics  . Alcohol use: Yes    Alcohol/week: 0.0 standard drinks    Comment: ocassionally  . Drug use: No    Review of Systems  Constitutional: No fever/chills Eyes: No visual changes. ENT: No sore throat. Cardiovascular: Denies chest pain. Respiratory: Denies shortness of breath. Gastrointestinal: No abdominal pain. no vomiting.  No diarrhea.  No constipation. Genitourinary: Negative for dysuria.  Positive for polyuria and polydipsia. Musculoskeletal: Negative for back pain. Skin: Negative for rash. Neurological: Negative for headaches, focal weakness or numbness.  ____________________________________________   PHYSICAL EXAM:  VITAL SIGNS: Vitals:   10/07/20 2339  BP: (!) 152/102  Pulse: (!) 116  Resp: 18  Temp: 99 F (37.2 C)  SpO2: 97%     Constitutional: Alert and oriented. Well appearing and in no acute distress.  Pleasant and conversational in full sentences. Eyes: Conjunctivae are normal. PERRL. EOMI. Head: Atraumatic. Nose: No congestion/rhinnorhea. Mouth/Throat: Mucous membranes are dry.  Oropharynx non-erythematous. Neck: No stridor. No cervical spine tenderness to palpation. Cardiovascular: Tachycardic rate, regular rhythm. Grossly normal heart sounds.  Good peripheral circulation. Respiratory: Normal respiratory effort.  No retractions. Lungs CTAB. Gastrointestinal: Soft , nondistended, nontender to palpation. No CVA tenderness. Musculoskeletal: No lower extremity tenderness nor edema.  No joint effusions. No signs of acute trauma. Neurologic:  Normal speech and language. No gross focal neurologic deficits are appreciated. No gait instability noted. Skin:  Skin is warm, dry and intact. No rash noted. Psychiatric: Mood and affect are normal. Speech and behavior are normal.  ____________________________________________   LABS (all labs ordered are listed, but only abnormal  results are displayed)  Labs Reviewed  CBC - Abnormal;  Notable for the following components:      Result Value   RBC 6.08 (*)    HCT 52.7 (*)    All other components within normal limits  URINALYSIS, COMPLETE (UACMP) WITH MICROSCOPIC - Abnormal; Notable for the following components:   Color, Urine COLORLESS (*)    APPearance CLEAR (*)    Glucose, UA >=500 (*)    Hgb urine dipstick MODERATE (*)    Ketones, ur 20 (*)    All other components within normal limits  BASIC METABOLIC PANEL - Abnormal; Notable for the following components:   Sodium 132 (*)    Chloride 97 (*)    CO2 19 (*)    Glucose, Bld 1,320 (*)    BUN 25 (*)    Creatinine, Ser 1.72 (*)    GFR, Estimated 55 (*)    Anion gap 16 (*)    All other components within normal limits  BETA-HYDROXYBUTYRIC ACID - Abnormal; Notable for the following components:   Beta-Hydroxybutyric Acid 4.12 (*)    All other components within normal limits  CBG MONITORING, ED - Abnormal; Notable for the following components:   Glucose-Capillary >600 (*)    All other components within normal limits  CBG MONITORING, ED - Abnormal; Notable for the following components:   Glucose-Capillary >600 (*)    All other components within normal limits  SARS CORONAVIRUS 2 (TAT 6-24 HRS)  URINALYSIS, ROUTINE W REFLEX MICROSCOPIC  BLOOD GAS, VENOUS  HEMOGLOBIN A1C   ____________________________________________   PROCEDURES and INTERVENTIONS  Procedure(s) performed (including Critical Care):  .1-3 Lead EKG Interpretation Performed by: Delton Prairie, MD Authorized by: Delton Prairie, MD     Interpretation: abnormal     ECG rate:  110   ECG rate assessment: tachycardic     Rhythm: sinus tachycardia     Ectopy: none     Conduction: normal   .Critical Care Performed by: Delton Prairie, MD Authorized by: Delton Prairie, MD   Critical care provider statement:    Critical care time (minutes):  35   Critical care was necessary to treat or prevent imminent or life-threatening deterioration of the following  conditions:  Metabolic crisis and endocrine crisis   Critical care was time spent personally by me on the following activities:  Discussions with consultants, evaluation of patient's response to treatment, examination of patient, ordering and performing treatments and interventions, ordering and review of laboratory studies, ordering and review of radiographic studies, pulse oximetry, re-evaluation of patient's condition, obtaining history from patient or surrogate and review of old charts    Medications  insulin regular, human (MYXREDLIN) 100 units/ 100 mL infusion (has no administration in time range)  lactated ringers infusion (has no administration in time range)  dextrose 5 % in lactated ringers infusion (has no administration in time range)  dextrose 50 % solution 0-50 mL (has no administration in time range)  potassium chloride 10 mEq in 100 mL IVPB (has no administration in time range)  sodium chloride 0.9 % bolus 1,000 mL (0 mLs Intravenous Stopped 10/08/20 0036)  sodium chloride 0.9 % bolus 1,000 mL (0 mLs Intravenous Stopped 10/08/20 0138)  sodium chloride 0.9 % bolus 1,000 mL (1,000 mLs Intravenous New Bag/Given 10/08/20 0138)    ____________________________________________   MDM / ED COURSE   30 year old male presents to the ED with new onset diabetes and DKA requiring insulin drip and medical admission.  Tachycardic, likely due to dehydration,  otherwise hemodynamically stable on room air.  Exam generally reassuring without evidence of acute derangements beyond stigmata of dehydration and his tachycardia.  He is in no distress, has a benign abdomen and has no neurovascular deficits.  Blood work demonstrates stigmata of DKA with hyperglycemia, acidosis and elevation of beta hydroxybutyric acid.  Potassium WNL and urinalysis without infectious features.  Insulin drip started per protocol and we will discussed the case with hospitalist medicine for admission.      ____________________________________________   FINAL CLINICAL IMPRESSION(S) / ED DIAGNOSES  Final diagnoses:  Diabetic ketoacidosis without coma associated with type 2 diabetes mellitus (HCC)  Dehydration  Hyperglycemia     ED Discharge Orders    None       Benjamin Richardson   Note:  This document was prepared using Conservation officer, historic buildings and may include unintentional dictation errors.   Delton Prairie, MD 10/08/20 [phone]

## 2020-10-08 NOTE — Progress Notes (Addendum)
  30 year old with history of seizures admitted this morning for hyperglycemia and DKA.  He was started on DKA protocol and IV fluids.  Gradually his anion gap closed and he was transitioned to subcutaneous insulin.  When I saw the patient at bedside he was comfortable and had completed eating his breakfast without any issues.  His father was present at bedside as well.  Patient denies having any previous diagnosis of diabetes mellitus either type I or type II.  His mother is diabetic.  Today's Vitals   10/08/20 0930 10/08/20 1000 10/08/20 1030 10/08/20 1100  BP: 131/88 (!) 139/101 (!) 144/112 128/75  Pulse: 90 (!) 105 (!) 228 94  Resp:   18 19  Temp:      TempSrc:      SpO2: 100% 96% 94% 95%  Weight:      Height:      PainSc:       Body mass index is 33.33 kg/m.  Physical Exam: Constitutional: Not in acute distress Respiratory: Clear to auscultation bilaterally Cardiovascular: Normal sinus rhythm, no rubs Abdomen: Nontender nondistended good bowel sounds Musculoskeletal: No edema noted Skin: No rashes seen Neurologic: CN 2-12 grossly intact.  And nonfocal Psychiatric: Normal judgment and insight. Alert and oriented x 3. Normal mood.   His anion gap is closed-this morning is 11.  Still has hyperglycemia with blood glucose 477. A1c-pending  Diabetic ketoacidosis, resolved.  Likely has type 1 diabetes-at this time with transition to subcutaneous insulin Lantus 25 units with sliding scale has been ordered.  Advised nursing staff to slowly turn off insulin drip 90 minutes after administration of subcu Lantus.  Diabetes education will be provided.  Diabetic coordinator has been consulted.  A1c currently is pending.  Monitor electrolytes. GAD and C peptid Ordered.   Hypernatremia/hyperchloremia -suspect from IV fluids.  All IV fluids have been discontinued at this time as patient is tolerating orals without any issues.  Continue to monitor sodium levels.  Acute kidney injury-no baseline  creatinine.  Admission creatinine 1.7 improved with IV fluids.  Creatinine this morning 1.48.  Mild Hypokalemia-repletion ordered  History of seizure-Keppra 5 mg twice daily  Discussed his case with patient's father at bedside as well.  Discussed this case with patient's RN.  Please call with further questions as needed.  Stephania Fragmin MD [facility]

## 2020-10-08 NOTE — Progress Notes (Signed)
PHARMACIST - PHYSICIAN COMMUNICATION  CONCERNING:  Enoxaparin (Lovenox) for DVT Prophylaxis    RECOMMENDATION: Patient was prescribed enoxaprin 40mg  q24 hours for VTE prophylaxis.   Filed Weights   10/07/20 2339  Weight: 113.9 kg (251 lb)    Body mass index is 33.12 kg/m.  Estimated Creatinine Clearance: 83.8 mL/min (A) (by C-G formula based on SCr of 1.72 mg/dL (H)).   Based on [facility] policy patient is candidate for enoxaparin 0.5mg /kg TBW SQ every 24 hours based on BMI being >30.  DESCRIPTION: Pharmacy has adjusted enoxaparin dose per [facility] policy.  Patient is now receiving enoxaparin 0.5 mg/kg every 24 hours   [facility], PharmD, [facility] 10/08/2020 4:50 AM

## 2020-10-08 NOTE — H&P (Signed)
History and Physical    Benjamin Richardson JEH:[id number] DOB: 03-Dec-1990 DOA: 10/08/2020  PCP: Patient, No Pcp Per   Patient coming from: home  I have personally briefly reviewed patient's old medical records in [facility]  Chief Complaint: weakness  HPI: Benjamin Richardson is a 30 y.o. male with medical history significant for seizure disorder, elevated blood pressure without diagnosis of hypertension who presents to the emergency room with a 1 month history of weakness that got acutely worse in the past 24 hours prompting his visit to the emergency room.  He endorses polyuria, polydipsia.  He denies cough, fever or chills, nausea vomiting, abdominal pain or diarrhea. ED Course: On arrival, BP 152/102, pulse 116 respirations 18 with O2 sat 97% on room air.  Temp 97%.  Blood work significant for blood glucose of 1320, anion gap of 16, venous pH of 7.25 with beta hydroxybutyric acid of 4.12.  Additionally creatinine 1.72, baseline unknown.  Urinalysis with 20 ketones, no pyuria EKG as reviewed by me : Sinus tachycardia at 110 with no acute ST-T wave changes  Patient started on IV fluid bolus and insulin infusion per DKA protocol.  Hospitalist consulted for admission.  Review of Systems: As per HPI otherwise all other systems on review of systems negative.    Past Medical History:  Diagnosis Date  . Elevated blood pressure reading without diagnosis of hypertension   . Isolated hypertriglyceridemia   . Obesity, Class I, BMI 30-34.9   . Pre-diabetes     History reviewed. No pertinent surgical history.   reports that he has never smoked. He has never used smokeless tobacco. He reports current alcohol use. He reports that he does not use drugs.  No Known Allergies  Family History  Problem Relation Age of Onset  . Diabetes Mother       Prior to Admission medications   Medication Sig Start Date End Date Taking? Authorizing Provider  levETIRAcetam (KEPPRA) 500 MG tablet Take 1 tablet  (500 mg total) by mouth 2 (two) times daily. 07/22/15   Emily Filbert, MD    Physical Exam: Vitals:   10/07/20 2339 10/08/20 0248 10/08/20 0300 10/08/20 0330  BP: (!) 152/102 (!) 133/95 (!) 134/95 126/90  Pulse: (!) 116 (!) 102 (!) 102 (!) 105  Resp: 18 18    Temp: 99 F (37.2 C)     TempSrc: Oral     SpO2: 97% 97% 96% 97%  Weight: 113.9 kg     Height: 6\' 1"  (1.854 m)        Vitals:   10/07/20 2339 10/08/20 0248 10/08/20 0300 10/08/20 0330  BP: (!) 152/102 (!) 133/95 (!) 134/95 126/90  Pulse: (!) 116 (!) 102 (!) 102 (!) 105  Resp: 18 18    Temp: 99 F (37.2 C)     TempSrc: Oral     SpO2: 97% 97% 96% 97%  Weight: 113.9 kg     Height: 6\' 1"  (1.854 m)         Constitutional: Alert and oriented x 3 . Not in any apparent distress HEENT:      Head: Normocephalic and atraumatic.         Eyes: PERLA, EOMI, Conjunctivae are normal. Sclera is non-icteric.       Mouth/Throat: Mucous membranes are moist.       Neck: Supple with no signs of meningismus. Cardiovascular: Regular rate and rhythm. No murmurs, gallops, or rubs. 2+ symmetrical distal pulses are present . No JVD. No LE  edema Respiratory: Respiratory effort normal .Lungs sounds clear bilaterally. No wheezes, crackles, or rhonchi.  Gastrointestinal: Soft, non tender, and non distended with positive bowel sounds.  Genitourinary: No CVA tenderness. Musculoskeletal: Nontender with normal range of motion in all extremities. No cyanosis, or erythema of extremities. Neurologic:  Face is symmetric. Moving all extremities. No gross focal neurologic deficits . Skin: Skin is warm, dry.  No rash or ulcers Psychiatric: Mood and affect are normal    Labs on Admission: I have personally reviewed following labs and imaging studies  CBC: Recent Labs  Lab 10/07/20 2342  WBC 9.4  HGB 17.0  HCT 52.7*  MCV 86.7  PLT 192   Basic Metabolic Panel: Recent Labs  Lab 10/07/20 2342  NA 132*  K 5.0  CL 97*  CO2 19*  GLUCOSE  1,320*  BUN 25*  CREATININE 1.72*  CALCIUM 10.0   GFR: Estimated Creatinine Clearance: 83.8 mL/min (A) (by C-G formula based on SCr of 1.72 mg/dL (H)). Liver Function Tests: No results for input(s): AST, ALT, ALKPHOS, BILITOT, PROT, ALBUMIN in the last 168 hours. No results for input(s): LIPASE, AMYLASE in the last 168 hours. No results for input(s): AMMONIA in the last 168 hours. Coagulation Profile: No results for input(s): INR, PROTIME in the last 168 hours. Cardiac Enzymes: No results for input(s): CKTOTAL, CKMB, CKMBINDEX, TROPONINI in the last 168 hours. BNP (last 3 results) No results for input(s): PROBNP in the last 8760 hours. HbA1C: No results for input(s): HGBA1C in the last 72 hours. CBG: Recent Labs  Lab 10/07/20 2342 10/08/20 0039 10/08/20 0236 10/08/20 0350  GLUCAP >600* >600* >600* 599*   Lipid Profile: No results for input(s): CHOL, HDL, LDLCALC, TRIG, CHOLHDL, LDLDIRECT in the last 72 hours. Thyroid Function Tests: No results for input(s): TSH, T4TOTAL, FREET4, T3FREE, THYROIDAB in the last 72 hours. Anemia Panel: No results for input(s): VITAMINB12, FOLATE, FERRITIN, TIBC, IRON, RETICCTPCT in the last 72 hours. Urine analysis:    Component Value Date/Time   COLORURINE COLORLESS (A) 10/07/2020 2335   APPEARANCEUR CLEAR (A) 10/07/2020 2335   LABSPEC 1.030 10/07/2020 2335   PHURINE 5.0 10/07/2020 2335   GLUCOSEU >=500 (A) 10/07/2020 2335   HGBUR MODERATE (A) 10/07/2020 2335   BILIRUBINUR NEGATIVE 10/07/2020 2335   KETONESUR 20 (A) 10/07/2020 2335   PROTEINUR NEGATIVE 10/07/2020 2335   NITRITE NEGATIVE 10/07/2020 2335   LEUKOCYTESUR NEGATIVE 10/07/2020 2335    Radiological Exams on Admission: No results found.   Assessment/Plan 30 year old male with history of seizure disorder, elevated blood pressure without prior diagnosis of hypertension presenting with generalized weakness    DKA (diabetic ketoacidosis) (HCC) -Patient with 1 month history  of weakness which became acutely worse in the days prior to presentation associated with polydipsia and polyuria -Blood sugar 1320 with anion gap of 16, beta hydroxybutyric acid of 4 and venous pH of 7.25 -Continue IV fluids, potassium monitoring and repletion -Continue insulin infusion per Endo tool -Follow A1c -Diabetic educator  AKI -Creatinine elevated at 1.72, baseline unknown -Likely secondary to dehydration with possibly underlying CKD -IV hydration and assess response    Seizure disorder (HCC) -Continue Keppra 500 mg twice daily    Essential hypertension -Likely new diagnosis of hypertension with blood pressure of 152/102 on arrival and prior isolated elevated BP readings -Monitor for improvement with treatment of DKA and start antihypertensives if still elevated  DVT prophylaxis: Lovenox  Code Status: full code  Family Communication: Father at bedside Disposition Plan: Back to previous home  environment Consults called: none  Status:At the time of admission, it appears that the appropriate admission status for this patient is INPATIENT. This is judged to be reasonable and necessary in order to provide the required intensity of service to ensure the patient's safety given the presenting symptoms, physical exam findings, and initial radiographic and laboratory data in the context of their  Comorbid conditions.   Patient requires inpatient status due to high intensity of service, high risk for further deterioration and high frequency of surveillance required.   I certify that at the point of admission it is my clinical judgment that the patient will require inpatient hospital care spanning beyond 2 midnights     Andris Baumann MD [facility]     10/08/2020, 4:38 AM

## 2020-10-08 NOTE — ED Notes (Signed)
Report received from Austin RN. Patient care assumed. Patient/RN introduction complete. Will continue to monitor.  

## 2020-10-08 NOTE — Progress Notes (Addendum)
Inpatient Diabetes Program Recommendations  AACE/ADA: New Consensus Statement on Inpatient Glycemic Control (2015)  Target Ranges:  Prepandial:   less than 140 mg/dL      Peak postprandial:   less than 180 mg/dL (1-2 hours)      Critically ill patients:  140 - 180 mg/dL   Results for GREYDEN, BESECKER (MRN [id number]) as of 10/08/2020 06:58  Ref. Range 10/07/2020 23:42  Sodium Latest Ref Range: 135 - 145 mmol/L 132 (L)  Potassium Latest Ref Range: 3.5 - 5.1 mmol/L 5.0  Chloride Latest Ref Range: 98 - 111 mmol/L 97 (L)  CO2 Latest Ref Range: 22 - 32 mmol/L 19 (L)  Glucose Latest Ref Range: 70 - 99 mg/dL 1,320 (HH)  BUN Latest Ref Range: 6 - 20 mg/dL 25 (H)  Creatinine Latest Ref Range: 0.61 - 1.24 mg/dL 1.72 (H)  Calcium Latest Ref Range: 8.9 - 10.3 mg/dL 10.0  Anion gap Latest Ref Range: 5 - 15  16 (H)   Results for LOXLEY, SCHMALE (MRN [id number]) as of 10/08/2020 06:58  Ref. Range 10/08/2020 01:16  Beta-Hydroxybutyric Acid Latest Ref Range: 0.05 - 0.27 mmol/L 4.12 (H)   Results for BARRON, VANLOAN (MRN [id number]) as of 10/08/2020 06:58  Ref. Range 10/07/2020 23:42 10/08/2020 00:39 10/08/2020 02:36 10/08/2020 03:50 10/08/2020 05:03 10/08/2020 05:45 10/08/2020 06:15 10/08/2020 06:43  Glucose-Capillary Latest Ref Range: 70 - 99 mg/dL >600 (HH) >600 (HH) >600 (HH)  IV Insulin Drip Started 599 (HH)  IV Insulin Drip 526 (HH)  IV Insulin Drip 410 (H)  IV Insulin Drip 437 (H)  IV Insulin Drip 397 (H)  IV Insulin Drip    Admit with: DKA/ New Diagnosis of Diabetes 1 month history of weakness which became acutely worse in the days prior to presentation associated with polydipsia and polyuria   Current Orders: IV Insulin Drip  PCP??    MD- Note 5:10am BMET showed the Anion Gap down to 11 and the CO2 level up to 25. Would wait to transition to SQ Insulin until the CBGs are below 200 for at least 2-4 readings  When CBGs have stabilized, recommend the following: --Start Lantus 25 units Daily  (~0.25 units/kg) Make sure to give 1st dose Lantus at least 1 hour prior to d/c of the IV Insulin Drip --Start Novolog Moderate Correction Scale/ SSI (0-15 units) TID AC + HS    Addendum 11:20am--Met w/ pt and his Dad at bedside this AM.  Spoke with pt about new diagnosis.  Explained what an A1C is (results pending), basic pathophysiology of DM Type 1 versus Type 2, basic home care, basic diabetes diet nutrition principles, importance of checking CBGs and maintaining good CBG control to prevent long-term and short-term complications.  Reviewed signs and symptoms of hyperglycemia and hypoglycemia and how to treat hypoglycemia at home.  Also reviewed blood sugar goals and A1c goals for home.    Pt and Dad had questions about Type 1 versus Type 2 diabetes.  I elaborated on the difference and explained that at this point we are waiting on further labs to determine how much insulin pt is making.  Explained to pt and Dad that based on pt's presentation with glucose >1300 and DKA that pt will likely need insulin at home for at least the next 3-6 months or longer.  Strongly encouraged pt to get established with a PCP for regular medical care and also strongly encouraged pt to get established with Endocrinologist given his age and need for insulin at diagnosis.  Left  list of ENDO practices in [facility] for pt to look over.  Also discussed DM diet information with patient.  Encouraged patient to avoid beverages with sugar (regular soda, sweet tea, lemonade, fruit juice) and to consume mostly water.  Discussed what foods contain carbohydrates and how carbohydrates affect the body's blood sugar levels.  Encouraged patient to be careful with his portion sizes (especially grains, starchy vegetables, and fruits).  Have ordered RD consult for more extensive diabetes diet edu and have also placed referral to the [facility] for outpatient diabetes education follow up.   Educated patient on insulin pen use at home.  Reviewed  contents of insulin flexpen starter kit.  Reviewed all steps of insulin pen including attachment of needle, 2-unit air shot, dialing up dose, giving injection, rotation of injection sites, removing needle, disposal of sharps, storage of unused insulin, disposal of insulin etc.  Patient able to provide successful return demonstration.  Reviewed troubleshooting with insulin pen.  Have asked RNs caring for patient to please allow patient to give all injections here in hospital as much as possible for practice.  MD to give patient Rxs for insulin pens and insulin pen needles.    RNs to provide ongoing basic DM education at bedside with this patient.  Have ordered educational booklet, insulin starter kit.  Have also placed RD consult for DM diet education for this patient.      --Will follow patient during hospitalization--  Wyn Quaker RN, MSN, CDE Diabetes Coordinator Inpatient Glycemic Control Team Team Pager: [phone] (8a-5p)

## 2020-10-09 DIAGNOSIS — N179 Acute kidney failure, unspecified: Secondary | ICD-10-CM

## 2020-10-09 DIAGNOSIS — E860 Dehydration: Secondary | ICD-10-CM

## 2020-10-09 LAB — BASIC METABOLIC PANEL
Anion gap: 8 (ref 5–15)
BUN: 16 mg/dL (ref 6–20)
CO2: 25 mmol/L (ref 22–32)
Calcium: 8.8 mg/dL — ABNORMAL LOW (ref 8.9–10.3)
Chloride: 106 mmol/L (ref 98–111)
Creatinine, Ser: 1.21 mg/dL (ref 0.61–1.24)
GFR, Estimated: >60 mL/min (ref >60)
Glucose, Bld: 216 mg/dL — ABNORMAL HIGH (ref 70–99)
Potassium: 3.6 mmol/L (ref 3.5–5.1)
Sodium: 139 mmol/L (ref 135–145)

## 2020-10-09 LAB — CBC
HCT: 42.6 % (ref 39.0–52.0)
Hemoglobin: 14 g/dL (ref 13.0–17.0)
MCH: 27.5 pg (ref 26.0–34.0)
MCHC: 32.9 g/dL (ref 30.0–36.0)
MCV: 83.7 fL (ref 80.0–100.0)
Platelets: 153 10*3/uL (ref 150–400)
RBC: 5.09 MIL/uL (ref 4.22–5.81)
RDW: 13.2 % (ref 11.5–15.5)
WBC: 10.7 10*3/uL — ABNORMAL HIGH (ref 4.0–10.5)
nRBC: 0 % (ref 0.0–0.2)

## 2020-10-09 LAB — HEMOGLOBIN A1C
Hgb A1c MFr Bld: 13.3 % — ABNORMAL HIGH (ref 4.8–5.6)
Mean Plasma Glucose: 335 mg/dL

## 2020-10-09 LAB — GLUCOSE, CAPILLARY
Glucose-Capillary: 195 mg/dL — ABNORMAL HIGH (ref 70–99)
Glucose-Capillary: 211 mg/dL — ABNORMAL HIGH (ref 70–99)
Glucose-Capillary: 242 mg/dL — ABNORMAL HIGH (ref 70–99)
Glucose-Capillary: 210 mg/dL — ABNORMAL HIGH (ref 70–99)

## 2020-10-09 LAB — MAGNESIUM: Magnesium: 1.7 mg/dL (ref 1.7–2.4)

## 2020-10-09 MED ORDER — ADULT MULTIVITAMIN W/MINERALS CH
1.0000 | ORAL_TABLET | Freq: Every day | ORAL | Status: DC
  Administered 2020-10-09 – 2020-10-10 (×2): 1 via ORAL
  Filled 2020-10-09 (×2): qty 1

## 2020-10-09 NOTE — Progress Notes (Signed)
Initial Nutrition Assessment  DOCUMENTATION CODES:   Obesity unspecified  INTERVENTION:   -Double protein portions at meals -MVI with minerals daily -Provided "Carbohydrate Counting for People with Diabetes" handout from [facility] Nutrition Care Manual; attached to AVS/ discharge summary -Referred to [place]'s Nutrition and Diabetes Education Services for outpatient diabetes education. Pt also referred to outpatient endocrinology and [facility] team has assisted with PCP  NUTRITION DIAGNOSIS:   Increased nutrient needs related to chronic illness (DM) as evidenced by estimated needs.  GOAL:   Patient will meet greater than or equal to 90% of their needs  MONITOR:   PO intake,Supplement acceptance,Diet advancement,Labs,Weight trends,Skin,I & O's  REASON FOR ASSESSMENT:   Consult,Malnutrition Screening Tool Diet education  ASSESSMENT:   30 year old male with history of seizure disorder, elevated blood pressure without prior diagnosis of hypertension presenting with generalized weakness  2/22- transitioned off insulin drip  Reviewed I/O's: +1.6 L x 24 hours and +1.4 L since admission  UOP: 950 ml x 24 hours   RD attempted to speak with pt x 3, however, unavailable at time of visits.   Per MD notes, pt with newly diagnosed DM. GAD and C-peptide have been ordered, however, no results back currently. DM coordinator has seen pt and referred to [facility]'s Nutrition and Diabetes Education Services for outpatient education as well as outpatient endocrinology. TOC has set up pt with PCP appointment. RD agrees with with these referrals for continued support, monitoring, and self-management.   Pt with good appetite. Noted meal completion 100%.   Reviewed wt hx; no weight loss over the past year.   Medications reviewed and include Keppra.   Labs reviewed: CBGS: 195-241 (inpatient orders for glycemic control are 0-15 units insulin aspart TID with meals, 0-5 insulin aspart daily at  bedtime, and 25 units insulin glargine daily).   Diet Order:   Diet Order            Diet Carb Modified Fluid consistency: Thin; Room service appropriate? Yes  Diet effective now                 EDUCATION NEEDS:   No education needs have been identified at this time  Skin:  Skin Assessment: Reviewed RN Assessment  Last BM:  10/07/20  Height:   Ht Readings from Last 1 Encounters:  10/09/20 6\' 2"  (1.88 m)    Weight:   Wt Readings from Last 1 Encounters:  10/09/20 119.9 kg    Ideal Body Weight:  86.4 kg  BMI:  Body mass index is 33.93 kg/m.  Estimated Nutritional Needs:   Kcal:  2400-2600  Protein:  130-145 grams  Fluid:  > 2 L    10/11/20, RD, LDN, CDCES Registered Dietitian II Certified Diabetes Care and Education Specialist Please refer to [facility] for RD and/or RD on-call/weekend/after hours pager

## 2020-10-09 NOTE — TOC Initial Note (Signed)
Transition of Care [facility]) - Initial/Assessment Note    Patient Details  Name: Benjamin Richardson MRN: [id number] Date of Birth: 1991-05-16  Transition of Care [facility]) CM/SW Contact:    Shelbie Hutching, RN Phone Number: 10/09/2020, 1:42 PM  Clinical Narrative:                 Patient admitted to the hospital with DKA, no history of diabetes, diagnosed this admission.  RNCM met with patient at the bedside.  Patient is from home where he lives alone.  Patient works at [employer].  Diabetes coordinator has been in to see patient and has him set up with some freestyle libra samples for blood sugar monitoring.  TOC received a consult to help patient set up PCP.  Patient wanted to be set up with [facility] but they did not have any providers that were accepting patients.  [facility] is accepting patients and patient agrees to get an appointment with them.  Patient scheduled for New patient appointment with Margurite Auerbach for Friday March 4th at 0930.  Appointment added to patient instructions that will print out with the AVS at discharge.    No other TOC needs identified at this time.  Expected Discharge Plan: Home/Self Care Barriers to Discharge: Continued Medical Work up   Patient Goals and CMS Choice Patient states their goals for this hospitalization and ongoing recovery are:: Patient wants to go home      Expected Discharge Plan and Services Expected Discharge Plan: Home/Self Care   Discharge Planning Services: CM Consult   Living arrangements for the past 2 months: Apartment                 DME Arranged: N/A DME Agency: NA       HH Arranged: NA          Prior Living Arrangements/Services Living arrangements for the past 2 months: Apartment Lives with:: Self Patient language and need for interpreter reviewed:: Yes Do you feel safe going back to the place where you live?: Yes      Need for Family Participation in Patient Care: No (Comment) Care giver support system  in place?: Yes (comment) (father)   Criminal Activity/Legal Involvement Pertinent to Current Situation/Hospitalization: No - Comment as needed  Activities of Daily Living Home Assistive Devices/Equipment: None ADL Screening (condition at time of admission) Patient's cognitive ability adequate to safely complete daily activities?: Yes Is the patient deaf or have difficulty hearing?: No Does the patient have difficulty seeing, even when wearing glasses/contacts?: No Does the patient have difficulty concentrating, remembering, or making decisions?: No Patient able to express need for assistance with ADLs?: Yes Does the patient have difficulty dressing or bathing?: No Independently performs ADLs?: Yes (appropriate for developmental age) Does the patient have difficulty walking or climbing stairs?: No Weakness of Legs: None Weakness of Arms/Hands: None  Permission Sought/Granted Permission sought to share information with : PCP Permission granted to share information with : Yes, Verbal Permission Granted     Permission granted to share info w AGENCY: [facility]        Emotional Assessment Appearance:: Appears stated age Attitude/Demeanor/Rapport: Engaged Affect (typically observed): Accepting Orientation: : Oriented to Self,Oriented to Place,Oriented to  Time,Oriented to Situation Alcohol / Substance Use: Not Applicable Psych Involvement: No (comment)  Admission diagnosis:  Dehydration [E86.0] DKA (diabetic ketoacidosis) ([place]) [E11.10] Hyperglycemia [R73.9] Diabetic ketoacidosis without coma associated with type 2 diabetes mellitus ([place]) [E11.10] Patient Active Problem List   Diagnosis Date  Noted   DKA (diabetic ketoacidosis) ([place]) 10/08/2020   Essential hypertension 10/08/2020   AKI (acute kidney injury) ([place]) 10/08/2020   Seizure disorder ([place]) 07/25/2015   Blood pressure elevated without history of HTN 03/28/2015   Hypertriglyceridemia 03/28/2015   Obesity,  Class II, BMI 35-39.9 03/28/2015   Borderline diabetes 03/28/2015   PCP:  Danelle Berry, NP Pharmacy:   [id number] [address], [place] [address] [place] [address] Phone: [phone] Fax: [phone]  [facility] [address], [place] [address] Phone: [phone] Fax: [phone]     Social Determinants of Health (SDOH) Interventions    Readmission Risk Interventions No flowsheet data found.

## 2020-10-09 NOTE — Progress Notes (Signed)
Benjamin Richardson  JOA:[id number] DOB: 1991-03-03 DOA: 10/08/2020 PCP: Patient, No Pcp Per    Brief Narrative:  30 year old with a history of seizure disorder and elevated BP who presented to the ED with a 1 month history of generalized weakness which she had become acutely worse in the 24 hours prior to his presentation.  He endorsed polyuria polydipsia.  In the ED he was Found to have a blood glucose of 1320 with an elevated beta hydroxybutyric acid and an anion gap of 16.  Antimicrobials:  None  DVT prophylaxis: Lovenox  Subjective: Afebrile.  Vital signs stable.  Patient states he is feeling much better overall.  Denies chest pain nausea vomiting or abdominal pain.  Reports that he was aware he was at risk for diabetes and that his mother has diabetes but that prior to this he experienced no symptoms to make him feel that he may actually have diabetes yet.  Assessment & Plan:  DKA and newly diagnosed uncontrolled diabetic DKA resolved with CBG improved and patient now off insulin drip -continue diet education -continue education on insulin dosing and CBG monitoring -if CBG remains stable overnight anticipate discharge home 2/24  Acute kidney injury Creatinine 1.72 at presentation with no known baseline -recheck in a.m.  Seizure disorder Continue usual home dose of Keppra  Essential HTN Blood pressure presently well controlled  Code Status: FULL CODE Family Communication: No family present at time of exam Status is: Inpatient  Remains inpatient appropriate because:Inpatient level of care appropriate due to severity of illness   Dispo: The patient is from: Home              Anticipated d/c is to: Home              Anticipated d/c date is: 1 day              Patient currently is not medically stable to d/c.   Difficult to place patient No   Consultants:  none  Objective: Blood pressure 118/76, pulse 74, temperature 98.1 F (36.7 C), temperature source Oral, resp. rate  18, height 6\' 2"  (1.88 m), weight 119.9 kg, SpO2 100 %.  Intake/Output Summary (Last 24 hours) at 10/09/2020 0958 Last data filed at 10/08/2020 2116 Gross per 24 hour  Intake 2463.65 ml  Output 950 ml  Net 1513.65 ml   Filed Weights   10/07/20 2339 10/08/20 0845 10/09/20 0300  Weight: 113.9 kg 114.6 kg 119.9 kg    Examination: General: No acute respiratory distress Lungs: Clear to auscultation bilaterally without wheezes or crackles Cardiovascular: Regular rate and rhythm without murmur gallop or rub normal S1 and S2 Abdomen: Nontender, nondistended, soft, bowel sounds positive, no rebound, no ascites, no appreciable mass Extremities: No significant cyanosis, clubbing, or edema bilateral lower extremities  CBC: Recent Labs  Lab 10/07/20 2342 10/08/20 0510 10/09/20 0431  WBC 9.4 11.2* 10.7*  HGB 17.0 16.6 14.0  HCT 52.7* 50.5 42.6  MCV 86.7 84.9 83.7  PLT 192 158 153   Basic Metabolic Panel: Recent Labs  Lab 10/08/20 1511 10/08/20 2026 10/09/20 0431  NA 140 139 139  K 3.9 3.8 3.6  CL 109 106 106  CO2 22 25 25   GLUCOSE 292* 261* 216*  BUN 17 16 16   CREATININE 1.23 1.34* 1.21  CALCIUM 9.1 9.2 8.8*  MG  --   --  1.7   GFR: Estimated Creatinine Clearance: 124 mL/min (by C-G formula based on SCr of 1.21 mg/dL).   HbA1C:  No results found for: HGBA1C  CBG: Recent Labs  Lab 10/08/20 0752 10/08/20 0845 10/08/20 1104 10/08/20 2217 10/09/20 0747  GLUCAP 280* 164* 239* 241* 195*    Recent Results (from the past 240 hour(s))  SARS CORONAVIRUS 2 (TAT 6-24 HRS) Nasopharyngeal Nasopharyngeal Swab     Status: None   Collection Time: 10/08/20  2:33 AM   Specimen: Nasopharyngeal Swab  Result Value Ref Range Status   SARS Coronavirus 2 NEGATIVE NEGATIVE Final    Comment: (NOTE) SARS-CoV-2 target nucleic acids are NOT DETECTED.  The SARS-CoV-2 RNA is generally detectable in upper and lower respiratory specimens during the acute phase of infection.  Negative results do not preclude SARS-CoV-2 infection, do not rule out co-infections with other pathogens, and should not be used as the sole basis for treatment or other patient management decisions. Negative results must be combined with clinical observations, patient history, and epidemiological information. The expected result is Negative.  Fact Sheet for Patients: HairSlick.no  Fact Sheet for Healthcare Providers: quierodirigir.com  This test is not yet approved or cleared by the [place] FDA and  has been authorized for detection and/or diagnosis of SARS-CoV-2 by FDA under an Emergency Use Authorization (EUA). This EUA will remain  in effect (meaning this test can be used) for the duration of the COVID-19 declaration under Se ction 564(b)(1) of the Act, 21 U.S.C. section 360bbb-3(b)(1), unless the authorization is terminated or revoked sooner.  Performed at [facility], [address] [place], [place] [address]      Scheduled Meds: . Chlorhexidine Gluconate Cloth  6 each Topical Daily  . enoxaparin (LOVENOX) injection  0.5 mg/kg Subcutaneous Q24H  . insulin aspart  0-15 Units Subcutaneous TID WC  . insulin aspart  0-5 Units Subcutaneous QHS  . insulin glargine  25 Units Subcutaneous Daily  . levETIRAcetam  500 mg Oral BID   Continuous Infusions: . insulin Stopped (10/08/20 1200)     LOS: 1 day   Lonia Blood, MD [facility] Office  [phone] Pager - Text Page per Amion  If 7PM-7AM, please contact night-coverage per Amion 10/09/2020, 9:58 AM

## 2020-10-09 NOTE — Discharge Instructions (Signed)
Local Endocrinologists:  [facility] [phone]) [[place] office]  [phone]) [[place] office] Dr. Wendall Mola Dr. Gelene Mink  [facility]--[phone]  [facility]--[phone]  Fingerstick glucose (sugar) goals for home: Before meals: 80-130 mg/dl 2-Hours after meals: less than 180 mg/dl Hemoglobin O8C goal: 7% or less  Carbohydrate Counting For People With Diabetes  Foods with carbohydrates make your blood glucose level go up. Learning how to count carbohydrates can help you control your blood glucose levels. First, identify the foods you eat that contain carbohydrates. Then, using the Foods with Carbohydrates chart, determine about how much carbohydrates are in your meals and snacks. Make sure you are eating foods with fiber, protein, and healthy fat along with your carbohydrate foods. Foods with Carbohydrates The following table shows carbohydrate foods that have about 15 grams of carbohydrate each. Using measuring cups, spoons, or a food scale when you first begin learning about carbohydrate counting can help you learn about the portion sizes you typically eat. The following foods have 15 grams carbohydrate each:  Grains  1 slice bread (1 ounce)   1 small tortilla (6-inch size)    large bagel (1 ounce)   1/3 cup pasta or rice (cooked)    hamburger or hot dog bun ( ounce)    cup cooked cereal    to  cup ready-to-eat cereal   2 taco shells (5-inch size) Fruit  1 small fresh fruit ( to 1 cup)    medium banana   17 small grapes (3 ounces)   1 cup melon or berries    cup canned or frozen fruit   2 tablespoons dried fruit (blueberries, cherries, cranberries, raisins)    cup unsweetened fruit juice  Starchy Vegetables   cup cooked beans, peas, corn, potatoes/sweet potatoes    large baked potato (3 ounces)   1 cup acorn or butternut squash  Snack Foods  3 to 6 crackers   8 potato chips or 13  tortilla chips ( ounce to 1 ounce)   3 cups popped popcorn  Dairy  3/4 cup (6 ounces) nonfat plain yogurt, or yogurt with sugar-free sweetener   1 cup milk   1 cup plain rice, soy, coconut or flavored almond milk Sweets and Desserts   cup ice cream or frozen yogurt   1 tablespoon jam, jelly, pancake syrup, table sugar, or honey   2 tablespoons light pancake syrup   1 inch square of frosted cake or 2 inch square of unfrosted cake   2 small cookies (2/3 ounce each) or  large cookie  Sometimes youll have to estimate carbohydrate amounts if you dont know the exact recipe. One cup of mixed foods like soups can have 1 to 2 carbohydrate servings, while some casseroles might have 2 or more servings of carbohydrate. Foods that have less than 20 calories in each serving can be counted as free foods. Count 1 cup raw vegetables, or  cup cooked non-starchy vegetables as free foods. If you eat 3 or more servings at one meal, then count them as 1 carbohydrate serving.  Foods without Carbohydrates  Not all foods contain carbohydrates. Meat, some dairy, fats, non-starchy vegetables, and many beverages dont contain carbohydrate. So when you count carbohydrates, you can generally exclude chicken, pork, beef, fish, seafood, eggs, tofu, cheese, butter, sour cream, avocado, nuts, seeds, olives, mayonnaise, water, black coffee, unsweetened tea, and zero-calorie drinks. Vegetables with no or low carbohydrate include green beans, cauliflower, tomatoes, and onions. How much carbohydrate should I eat  at each meal?  Carbohydrate counting can help you plan your meals and manage your weight. Following are some starting points for carbohydrate intake at each meal. Work with your registered dietitian nutritionist to find the best range that works for your blood glucose and weight.   To Lose Weight To Maintain Weight  Women 2 - 3 carb servings 3 - 4 carb servings  Men 3 - 4 carb servings 4 - 5 carb servings   Checking your blood glucose after meals will help you know if you need to adjust the timing, type, or number of carbohydrate servings in your meal plan. Achieve and keep a healthy body weight by balancing your food intake and physical activity.  Tips How should I plan my meals?  Plan for half the food on your plate to include non-starchy vegetables, like salad greens, broccoli, or carrots. Try to eat 3 to 5 servings of non-starchy vegetables every day. Have a protein food at each meal. Protein foods include chicken, fish, meat, eggs, or beans (note that beans contain carbohydrate). These two food groups (non-starchy vegetables and proteins) are low in carbohydrate. If you fill up your plate with these foods, you will eat less carbohydrate but still fill up your stomach. Try to limit your carbohydrate portion to  of the plate.  What fats are healthiest to eat?  Diabetes increases risk for heart disease. To help protect your heart, eat more healthy fats, such as olive oil, nuts, and avocado. Eat less saturated fats like butter, cream, and high-fat meats, like bacon and sausage. Avoid trans fats, which are in all foods that list partially hydrogenated oil as an ingredient. What should I drink?  Choose drinks that are not sweetened with sugar. The healthiest choices are water, carbonated or seltzer waters, and tea and coffee without added sugars.  Sweet drinks will make your blood glucose go up very quickly. One serving of soda or energy drink is  cup. It is best to drink these beverages only if your blood glucose is low.  Artificially sweetened, or diet drinks, typically do not increase your blood glucose if they have zero calories in them. Read labels of beverages, as some diet drinks do have carbohydrate and will raise your blood glucose. Label Reading Tips Read Nutrition Facts labels to find out how many grams of carbohydrate are in a food you want to eat. Dont forget: sometimes serving sizes on  the label arent the same as how much food you are going to eat, so you may need to calculate how much carbohydrate is in the food you are serving yourself.   Carbohydrate Counting for People with Diabetes Sample 1-Day Menu  Breakfast  cup yogurt, low fat, low sugar (1 carbohydrate serving)   cup cereal, ready-to-eat, unsweetened (1 carbohydrate serving)  1 cup strawberries (1 carbohydrate serving)   cup almonds ( carbohydrate serving)  Lunch 1, 5 ounce can chunk light tuna  2 ounces cheese, low fat cheddar  6 whole wheat crackers (1 carbohydrate serving)  1 small apple (1 carbohydrate servings)   cup carrots ( carbohydrate serving)   cup snap peas  1 cup 1% milk (1 carbohydrate serving)   Evening Meal Stir fry made with: 3 ounces chicken  1 cup brown rice (3 carbohydrate servings)   cup broccoli ( carbohydrate serving)   cup green beans   cup onions  1 tablespoon olive oil  2 tablespoons teriyaki sauce ( carbohydrate serving)  Evening Snack 1 extra small banana (  1 carbohydrate serving)  1 tablespoon peanut butter   Carbohydrate Counting for People with Diabetes Vegan Sample 1-Day Menu  Breakfast 1 cup cooked oatmeal (2 carbohydrate servings)   cup blueberries (1 carbohydrate serving)  2 tablespoons flaxseeds  1 cup soymilk fortified with calcium and vitamin D  1 cup coffee  Lunch 2 slices whole wheat bread (2 carbohydrate servings)   cup baked tofu   cup lettuce  2 slices tomato  2 slices avocado   cup baby carrots ( carbohydrate serving)  1 orange (1 carbohydrate serving)  1 cup soymilk fortified with calcium and vitamin D   Evening Meal Burrito made with: 1 6-inch corn tortilla (1 carbohydrate serving)  1 cup refried vegetarian beans (2 carbohydrate servings)   cup chopped tomatoes   cup lettuce   cup salsa  1/3 cup brown rice (1 carbohydrate serving)  1 tablespoon olive oil for rice   cup zucchini   Evening Snack 6 small whole grain crackers (1  carbohydrate serving)  2 apricots ( carbohydrate serving)   cup unsalted peanuts ( carbohydrate serving)    Carbohydrate Counting for People with Diabetes Vegetarian (Lacto-Ovo) Sample 1-Day Menu  Breakfast 1 cup cooked oatmeal (2 carbohydrate servings)   cup blueberries (1 carbohydrate serving)  2 tablespoons flaxseeds  1 egg  1 cup 1% milk (1 carbohydrate serving)  1 cup coffee  Lunch 2 slices whole wheat bread (2 carbohydrate servings)  2 ounces low-fat cheese   cup lettuce  2 slices tomato  2 slices avocado   cup baby carrots ( carbohydrate serving)  1 orange (1 carbohydrate serving)  1 cup unsweetened tea  Evening Meal Burrito made with: 1 6-inch corn tortilla (1 carbohydrate serving)   cup refried vegetarian beans (1 carbohydrate serving)   cup tomatoes   cup lettuce   cup salsa  1/3 cup brown rice (1 carbohydrate serving)  1 tablespoon olive oil for rice   cup zucchini  1 cup 1% milk (1 carbohydrate serving)  Evening Snack 6 small whole grain crackers (1 carbohydrate serving)  2 apricots ( carbohydrate serving)   cup unsalted peanuts ( carbohydrate serving)    Copyright 2020  Academy of Nutrition and Dietetics. All rights reserved.  Using Nutrition Labels: Carbohydrate   Serving Size   Look at the serving size. All the information on the label is based on this portion.  Servings Per Container   The number of servings contained in the package.  Guidelines for Carbohydrate   Look at the total grams of carbohydrate in the serving size.   1 carbohydrate choice = 15 grams of carbohydrate. Range of Carbohydrate Grams Per Choice  Carbohydrate Grams/Choice Carbohydrate Choices  6-10   11-20 1  21-25 1  26-35 2  36-40 2  41-50 3  51-55 3  56-65 4  66-70 4  71-80 5    Copyright 2020  Academy of Nutrition and Dietetics. All rights reserved.

## 2020-10-09 NOTE — Progress Notes (Signed)
Inpatient Diabetes Program Recommendations  AACE/ADA: New Consensus Statement on Inpatient Glycemic Control (2015)  Target Ranges:  Prepandial:   less than 140 mg/dL      Peak postprandial:   less than 180 mg/dL (1-2 hours)      Critically ill patients:  140 - 180 mg/dL   Lab Results  Component Value Date   GLUCAP 211 (H) 10/09/2020   HGBA1C 13.3 (H) 10/07/2020    Review of Glycemic Control Results for Benjamin Richardson, Benjamin Richardson (MRN [id number]) as of 10/09/2020 15:07  Ref. Range 10/08/2020 08:45 10/08/2020 11:04 10/08/2020 22:17 10/09/2020 07:47 10/09/2020 12:32  Glucose-Capillary Latest Ref Range: 70 - 99 mg/dL 578 (H) 469 (H) 629 (H) 195 (H) 211 (H)   Diabetes history: New Dx. DM Current orders for Inpatient glycemic control:  Novolog moderate tid with meals and HS Lantus 25 units daily  Inpatient Diabetes Program Recommendations:    Education done regarding application and changing CGM sensor (alternate every 14 days on back of arms), 1 hour warm-up, use of glucometer/where to buy strips, how to scan CGM for glucose reading and information for PCP. Patient has also been given educational packet regarding use CGM sensor including the 1-800 toll free number for any questions, problems or needs related to the [facility] sensors or reader.  Patient to be given Rx. For sensors with prescriptions/discharge paper work.  Sensor applied by patient to ( L) Arm at 1500.  Explained that glucose readings will not be available until 1 hour after application. Patient appreciative.   Thanks,  Beryl Meager, RN, BC-ADM Inpatient Diabetes Coordinator Pager [phone] ([phone])

## 2020-10-10 DIAGNOSIS — E1310 Other specified diabetes mellitus with ketoacidosis without coma: Secondary | ICD-10-CM

## 2020-10-10 LAB — COMPREHENSIVE METABOLIC PANEL
ALT: 34 U/L (ref 0–44)
AST: 28 U/L (ref 15–41)
Albumin: 3.2 g/dL — ABNORMAL LOW (ref 3.5–5.0)
Alkaline Phosphatase: 56 U/L (ref 38–126)
Anion gap: 6 (ref 5–15)
BUN: 16 mg/dL (ref 6–20)
CO2: 29 mmol/L (ref 22–32)
Calcium: 8.6 mg/dL — ABNORMAL LOW (ref 8.9–10.3)
Chloride: 105 mmol/L (ref 98–111)
Creatinine, Ser: 0.99 mg/dL (ref 0.61–1.24)
GFR, Estimated: >60 mL/min (ref >60)
Glucose, Bld: 227 mg/dL — ABNORMAL HIGH (ref 70–99)
Potassium: 3.9 mmol/L (ref 3.5–5.1)
Sodium: 140 mmol/L (ref 135–145)
Total Bilirubin: 0.9 mg/dL (ref 0.3–1.2)
Total Protein: 5.5 g/dL — ABNORMAL LOW (ref 6.5–8.1)

## 2020-10-10 LAB — TSH: TSH: 1.772 u[IU]/mL (ref 0.350–4.500)

## 2020-10-10 LAB — GLUCOSE, CAPILLARY
Glucose-Capillary: 194 mg/dL — ABNORMAL HIGH (ref 70–99)
Glucose-Capillary: 236 mg/dL — ABNORMAL HIGH (ref 70–99)

## 2020-10-10 LAB — C-PEPTIDE: C-Peptide: 1 ng/mL — ABNORMAL LOW (ref 1.1–4.4)

## 2020-10-10 LAB — GLUTAMIC ACID DECARBOXYLASE AUTO ABS: Glutamic Acid Decarb Ab: 114.8 U/mL — ABNORMAL HIGH (ref 0.0–5.0)

## 2020-10-10 MED ORDER — METFORMIN HCL 500 MG PO TABS: 500.0000 mg | ORAL_TABLET | Freq: Two times a day (BID) | ORAL | 11 refills | Status: AC

## 2020-10-10 MED ORDER — METFORMIN HCL 500 MG PO TABS
500.0000 mg | ORAL_TABLET | Freq: Two times a day (BID) | ORAL | Status: DC
  Filled 2020-10-10: qty 1

## 2020-10-10 MED ORDER — NOVOLOG FLEXPEN 100 UNIT/ML ~~LOC~~ SOPN: PEN_INJECTOR | SUBCUTANEOUS | 11 refills | Status: DC

## 2020-10-10 MED ORDER — INSULIN PEN NEEDLE 32G X 4 MM MISC: 1.0000 | Freq: Four times a day (QID) | 11 refills | Status: AC

## 2020-10-10 MED ORDER — COVID-19 MRNA VACC (MODERNA) 100 MCG/0.5ML IM SUSP
0.5000 mL | INTRAMUSCULAR | Status: AC
  Administered 2020-10-10: 0.5 mL via INTRAMUSCULAR
  Filled 2020-10-10: qty 0.5

## 2020-10-10 MED ORDER — COVID-19 MRNA VACC (MODERNA) 100 MCG/0.5ML IM SUSP: 0.5000 mL | Freq: Once | INTRAMUSCULAR | Status: DC

## 2020-10-10 MED ORDER — INSULIN ASPART 100 UNIT/ML ~~LOC~~ SOLN
0.0000 [IU] | Freq: Three times a day (TID) | SUBCUTANEOUS | Status: DC
  Administered 2020-10-10: 3 [IU] via SUBCUTANEOUS
  Filled 2020-10-10: qty 1

## 2020-10-10 MED ORDER — INSULIN GLARGINE 100 UNIT/ML SOLOSTAR PEN: 25.0000 [IU] | PEN_INJECTOR | Freq: Every day | SUBCUTANEOUS | 11 refills | Status: DC

## 2020-10-10 NOTE — Discharge Summary (Signed)
DISCHARGE SUMMARY  Benjamin Richardson  MR#: [id number]  DOB:05/20/1991  Date of Admission: 10/08/2020 Date of Discharge: 10/10/2020  Attending Physician:Jeffrey Silvestre Gunner, MD  Patient's PJK:DTOIZTI, Benjamin Kea, NP  Consults: none   Disposition: D/C home    Follow-up Appts:  Follow-up Information    Fayrene Helper, NP. Go on 10/18/2020.   Specialty: Nurse Practitioner Why: Appointment scheduled at 9:30 am, arrive 15 minutes before scheduled time.  Take a photo ID and your insurance card.   Friday March 4th. Contact information: [address] [place] [address] [phone]               Tests Needing Follow-up: -assess CBG control -assess BP  -Glutamic acid decarboxylase is pending at time of d/c    Discharge Diagnoses: DKA in newly diagnosed uncontrolled DM Acute kidney injury Seizure disorder Essential HTN  Initial presentation: 29yo with a history of seizure disorder and elevated BP who presented to the ED with a 1 month history of generalized weakness which she had become acutely worse in the 24 hours prior to his presentation.  He endorsed polyuria polydipsia.  In the ED he was Found to have a blood glucose of 1320 with an elevated beta hydroxybutyric acid and an anion gap of 16.  Hospital Course:  DKA and newly diagnosed uncontrolled diabetic DKA resolved with CBG improved and stable off insulin drip - diet education begun - education on insulin dosing and CBG monitoring provided - pt instructed on use of and provided w/ freestyle libre CBG system (his new PCP will need to prescribe this device again after 28 days)   Acute kidney injury Creatinine 1.72 at presentation with no known baseline - normalized at time of d/c   Seizure disorder Continue usual home dose of Keppra  Essential HTN Blood pressure presently well controlled  Allergies as of 10/10/2020   No Known Allergies     Medication List    TAKE these medications   insulin glargine  100 UNIT/ML Solostar Pen Commonly known as: LANTUS Inject 25 Units into the skin daily.   Insulin Pen Needle 32G X 4 MM Misc 1 Dose by Does not apply route in the morning, at noon, in the evening, and at bedtime.   levETIRAcetam 500 MG tablet Commonly known as: Keppra Take 1 tablet (500 mg total) by mouth 2 (two) times daily.   metFORMIN 500 MG tablet Commonly known as: GLUCOPHAGE Take 1 tablet (500 mg total) by mouth 2 (two) times daily with a meal.   NovoLOG FlexPen 100 UNIT/ML FlexPen Generic drug: insulin aspart Administer a sliding scale insulin dose based upon your CBG values 3 times a day with meals, according to the following scale: CBG 70-120 = 0 units CBG 121-150 = 1 unit CBG 151-200 = 2 units CBG 201-250 = 3 units CBG 251-300 = 5 units CBG 301-350 = 7 units CBG 351-400 = 9 units CBG > 400 = consult with your doctor       Day of Discharge BP 114/73 (BP Location: Right Arm)   Pulse 68   Temp 98.4 F (36.9 C) (Oral)   Resp 18   Ht 6\' 2"  (1.88 m)   Wt 119.9 kg   SpO2 100%   BMI 33.93 kg/m   Physical Exam: General: No acute respiratory distress Lungs: Clear to auscultation bilaterally without wheezes or crackles Cardiovascular: Regular rate and rhythm without murmur gallop or rub normal S1 and S2 Abdomen: Nontender, nondistended, soft, bowel sounds positive, no rebound,  no ascites, no appreciable mass Extremities: No significant cyanosis, clubbing, or edema bilateral lower extremities  Basic Metabolic Panel: Recent Labs  Lab 10/08/20 1130 10/08/20 1511 10/08/20 2026 10/09/20 0431 10/10/20 0438  NA 146* 140 139 139 140  K 3.8 3.9 3.8 3.6 3.9  CL 113* 109 106 106 105  CO2 23 22 25 25 29   GLUCOSE 239* 292* 261* 216* 227*  BUN 15 17 16 16 16   CREATININE 1.15 1.23 1.34* 1.21 0.99  CALCIUM 9.6 9.1 9.2 8.8* 8.6*  MG  --   --   --  1.7  --     Liver Function Tests: Recent Labs  Lab 10/10/20 0438  AST 28  ALT 34  ALKPHOS 56  BILITOT 0.9  PROT  5.5*  ALBUMIN 3.2*    CBC: Recent Labs  Lab 10/07/20 2342 10/08/20 0510 10/09/20 0431  WBC 9.4 11.2* 10.7*  HGB 17.0 16.6 14.0  HCT 52.7* 50.5 42.6  MCV 86.7 84.9 83.7  PLT 192 158 153     CBG: Recent Labs  Lab 10/09/20 0747 10/09/20 1232 10/09/20 1612 10/09/20 2151 10/10/20 0810  GLUCAP 195* 211* 242* 210* 194*    Recent Results (from the past 240 hour(s))  SARS CORONAVIRUS 2 (TAT 6-24 HRS) Nasopharyngeal Nasopharyngeal Swab     Status: None   Collection Time: 10/08/20  2:33 AM   Specimen: Nasopharyngeal Swab  Result Value Ref Range Status   SARS Coronavirus 2 NEGATIVE NEGATIVE Final    Comment: (NOTE) SARS-CoV-2 target nucleic acids are NOT DETECTED.  The SARS-CoV-2 RNA is generally detectable in upper and lower respiratory specimens during the acute phase of infection. Negative results do not preclude SARS-CoV-2 infection, do not rule out co-infections with other pathogens, and should not be used as the sole basis for treatment or other patient management decisions. Negative results must be combined with clinical observations, patient history, and epidemiological information. The expected result is Negative.  Fact Sheet for Patients: 10/12/20  Fact Sheet for Healthcare Providers: 10/10/20  This test is not yet approved or cleared by the [employer] FDA and  has been authorized for detection and/or diagnosis of SARS-CoV-2 by FDA under an Emergency Use Authorization (EUA). This EUA will remain  in effect (meaning this test can be used) for the duration of the COVID-19 declaration under Se ction 564(b)(1) of the Act, 21 U.S.C. section 360bbb-3(b)(1), unless the authorization is terminated or revoked sooner.  Performed at [facility], [address] [place], [address] [place]       Time spent in discharge (includes decision making & examination of pt): 35  minutes  10/10/2020, 11:06 AM   [id number], MD [facility] Office  [phone]

## 2020-10-10 NOTE — Progress Notes (Signed)
   10/10/20   To Whom it may concern,  Benjamin Richardson was admitted to [facility] on 10/08/2020  and remained under my care in the hospital through 10/10/2020.  He has been advised that he should not return to work until 10/14/2020, at which time he will be cleared to resume all of his usual responsibilities.  Sincerely,  Lonia Blood, MD [facility] Office  [phone]

## 2020-10-10 NOTE — Progress Notes (Signed)
Inpatient Diabetes Program Recommendations  AACE/ADA: New Consensus Statement on Inpatient Glycemic Control (2015)  Target Ranges:  Prepandial:   less than 140 mg/dL      Peak postprandial:   less than 180 mg/dL (1-2 hours)      Critically ill patients:  140 - 180 mg/dL   Lab Results  Component Value Date   GLUCAP 194 (H) 10/10/2020   HGBA1C 13.3 (H) 10/07/2020    Review of Glycemic Control Results for Benjamin Richardson, Benjamin Richardson (MRN [id number]) as of 10/10/2020 08:52  Ref. Range 10/09/2020 07:47 10/09/2020 12:32 10/09/2020 16:12 10/09/2020 21:51 10/10/2020 08:10  Glucose-Capillary Latest Ref Range: 70 - 99 mg/dL 993 (H) 716 (H) 967 (H) 210 (H) 194 (H)   Diabetes history: DM - New Diagnosis Outpatient Diabetes medications: None Current orders for Inpatient glycemic control:  Novolog moderate tid with meals and HS Lantus 25 units daily  Inpatient Diabetes Program Recommendations:    Note that CBG's still slightly >goal.  C-Peptide is on the low side?  May indicate that patient needs insulin indefinitely?? Needs close follow-up with endocrinology.   He still would likely benefit from the addition of Metformin 500 mg bid.  Initially may be able to go home on Lantus/Metformin with a Sensitive correction scale tid with meals?  Patient is now wearing sensor and checking blood sugars often.  He does work nights and had questions regarding when to take insulin.  Instructed patient to take Lantus at the same time every day and reminded him of the profile of 24 hour basal insulin.  Then explained that Novolog is for meal times and that if he is working nights and eating, he will need to check blood sugars/take insulin with each meal.  Patient verbalized understanding.    Thanks,  Beryl Meager, RN, BC-ADM Inpatient Diabetes Coordinator Pager [phone] (8a-5p)

## 2021-12-13 ENCOUNTER — Emergency Department
Admission: EM | Admit: 2021-12-13 | Discharge: 2021-12-13 | Disposition: A | Discharge status: Home / Self Care | Payer: Managed Care, Other (non HMO) | Attending: Emergency Medicine | Admitting: Emergency Medicine

## 2021-12-13 ENCOUNTER — Emergency Department: Payer: Managed Care, Other (non HMO)

## 2021-12-13 DIAGNOSIS — R569 Unspecified convulsions: Secondary | ICD-10-CM | POA: Diagnosis present

## 2021-12-13 DIAGNOSIS — E119 Type 2 diabetes mellitus without complications: Secondary | ICD-10-CM | POA: Diagnosis not present

## 2021-12-13 DIAGNOSIS — Z7984 Long term (current) use of oral hypoglycemic drugs: Secondary | ICD-10-CM | POA: Insufficient documentation

## 2021-12-13 HISTORY — DX: Unspecified convulsions: R56.9

## 2021-12-13 LAB — CBC WITH DIFFERENTIAL/PLATELET
Abs Immature Granulocytes: 0.02 10*3/uL (ref 0.00–0.07)
Basophils Absolute: 0 10*3/uL (ref 0.0–0.1)
Basophils Relative: 0 %
Eosinophils Absolute: 0.1 10*3/uL (ref 0.0–0.5)
Eosinophils Relative: 1 %
HCT: 47.5 % (ref 39.0–52.0)
Hemoglobin: 15.3 g/dL (ref 13.0–17.0)
Immature Granulocytes: 0 %
Lymphocytes Relative: 38 %
Lymphs Abs: 2.9 10*3/uL (ref 0.7–4.0)
MCH: 26.9 pg (ref 26.0–34.0)
MCHC: 32.2 g/dL (ref 30.0–36.0)
MCV: 83.6 fL (ref 80.0–100.0)
Monocytes Absolute: 0.5 10*3/uL (ref 0.1–1.0)
Monocytes Relative: 7 %
Neutro Abs: 4.1 10*3/uL (ref 1.7–7.7)
Neutrophils Relative %: 54 %
Platelets: 188 10*3/uL (ref 150–400)
RBC: 5.68 MIL/uL (ref 4.22–5.81)
RDW: 12.9 % (ref 11.5–15.5)
WBC: 7.7 10*3/uL (ref 4.0–10.5)
nRBC: 0 % (ref 0.0–0.2)

## 2021-12-13 LAB — COMPREHENSIVE METABOLIC PANEL
ALT: 48 U/L — ABNORMAL HIGH (ref 0–44)
AST: 50 U/L — ABNORMAL HIGH (ref 15–41)
Albumin: 4.6 g/dL (ref 3.5–5.0)
Alkaline Phosphatase: 48 U/L (ref 38–126)
Anion gap: 16 — ABNORMAL HIGH (ref 5–15)
BUN: 16 mg/dL (ref 6–20)
CO2: 20 mmol/L — ABNORMAL LOW (ref 22–32)
Calcium: 9.1 mg/dL (ref 8.9–10.3)
Chloride: 103 mmol/L (ref 98–111)
Creatinine, Ser: 1.38 mg/dL — ABNORMAL HIGH (ref 0.61–1.24)
GFR, Estimated: >60 mL/min (ref >60)
Glucose, Bld: 161 mg/dL — ABNORMAL HIGH (ref 70–99)
Potassium: 4.3 mmol/L (ref 3.5–5.1)
Sodium: 139 mmol/L (ref 135–145)
Total Bilirubin: 0.4 mg/dL (ref 0.3–1.2)
Total Protein: 7.5 g/dL (ref 6.5–8.1)

## 2021-12-13 MED ORDER — LACTATED RINGERS IV BOLUS
1000.0000 mL | Freq: Once | INTRAVENOUS | Status: AC
  Administered 2021-12-13: 1000 mL via INTRAVENOUS

## 2021-12-13 MED ORDER — LEVETIRACETAM IN NACL 1500 MG/100ML IV SOLN
1500.0000 mg | Freq: Once | INTRAVENOUS | Status: AC
  Administered 2021-12-13: 1500 mg via INTRAVENOUS
  Filled 2021-12-13: qty 100

## 2021-12-13 NOTE — ED Provider Notes (Signed)
? ?[facility] ?Provider Note ? ? ? Event Date/Time  ? First MD Initiated Contact with Patient 12/13/21 0827   ?  (approximate) ? ? ?History  ? ?Seizures (Seizure at work, loc, pt on keppra, seizure observed by coworkers, tonic clonic , pt axox4 now ) ? ? ?HPI ? ?Benjamin Richardson is a 31 y.o. male who presents to the ED for evaluation of Seizures (Seizure at work, loc, pt on keppra, seizure observed by coworkers, tonic clonic , pt axox4 now ) ?  ?I review [facility] neurology visit from 11/8.  Longstanding seizure disorder been controlled on Keppra for many years.  Also takes metformin for diabetes. ? ?Patient presents to the ED via EMS from his workplace for evaluation of a witnessed seizure at work.  Reported generalized tonic-clonic activity for a matter of minutes that self resolved without need to administer abortive medications.  EMS reports him being postictal, but clearing up on rounds and he presents to the ED alert and oriented at his baseline. ? ?Patient reports he has not had a seizure for couple years and reports compliance with his Keppra.  Denies any recent illnesses.  Reports feeling fine last night, sleeping well and feeling normal this morning.  Says he felt a little bit weird leading up to the event, but denies any significant prodrome. ? ?EMS reports finding the sats of 92%.  Patient reports no recent cough, shortness of breath or chest pain.  Feels fine now. ? ?Physical Exam  ? ?Triage Vital Signs: ?ED Triage Vitals  ?Enc Vitals Group  ?   BP 12/13/21 0827 115/71  ?   Pulse Rate 12/13/21 0825 100  ?   Resp 12/13/21 0825 17  ?   Temp 12/13/21 0825 98.5 ?F (36.9 ?C)  ?   Temp Source 12/13/21 0825 Oral  ?   SpO2 12/13/21 0825 95 %  ?   Weight 12/13/21 0825 264 lb 8.8 oz (120 kg)  ?   Height 12/13/21 0825 5\' 10"  (1.778 m)  ?   Head Circumference --   ?   Peak Flow --   ?   Pain Score --   ?   Pain Loc --   ?   Pain Edu? --   ?   Excl. in GC? --   ? ? ?Most recent vital  signs: ?Vitals:  ? 12/13/21 0825 12/13/21 0827  ?BP:  115/71  ?Pulse: 100   ?Resp: 17   ?Temp: 98.5 ?F (36.9 ?C) 98.3 ?F (36.8 ?C)  ?SpO2: 95%   ? ? ?General: Awake, no distress.  Pleasant and conversational.  Stands up and ambulates to our stretcher from EMS stretcher on arrival. ?CV:  Good peripheral perfusion.  ?Resp:  Normal effort.  ?Abd:  No distention.  ?MSK:  No deformity noted.  ?Neuro:  No focal deficits appreciated. Cranial nerves II through XII intact ?5/5 strength and sensation in all 4 extremities ?Other:   ? ? ?ED Results / Procedures / Treatments  ? ?Labs ?(all labs ordered are listed, but only abnormal results are displayed) ?Labs Reviewed  ?COMPREHENSIVE METABOLIC PANEL - Abnormal; Notable for the following components:  ?    Result Value  ? CO2 20 (*)   ? Glucose, Bld 161 (*)   ? Creatinine, Ser 1.38 (*)   ? AST 50 (*)   ? ALT 48 (*)   ? Anion gap 16 (*)   ? All other components within normal limits  ?CBC WITH DIFFERENTIAL/PLATELET  ?  URINALYSIS, ROUTINE W REFLEX MICROSCOPIC  ?LEVETIRACETAM LEVEL  ? ? ?EKG ?Sinus rhythm, rate of 97 bpm.  Normal axis and intervals.  Stigmata of LVH.  Nonspecific ST changes to lead III.  No STEMI. ?Comparison from 2020 is essentially identical ? ?RADIOLOGY ?CXR reviewed by me without evidence of acute cardiopulmonary pathology. ? ?Official radiology report(s): ?DG Chest 2 View ? ?Result Date: 12/13/2021 ?CLINICAL DATA:  Seizure. EXAM: CHEST - 2 VIEW COMPARISON:  07/27/19 FINDINGS: The heart size and mediastinal contours are within normal limits. Both lungs are clear. The visualized skeletal structures are unremarkable. IMPRESSION: No active cardiopulmonary disease. Electronically Signed   By: Signa Kell M.D.   On: 12/13/2021 09:48   ? ?PROCEDURES and INTERVENTIONS: ? ?.1-3 Lead EKG Interpretation ?Performed by: Delton Prairie, MD ?Authorized by: Delton Prairie, MD  ? ?  Interpretation: normal   ?  ECG rate:  90 ?  ECG rate assessment: normal   ?  Rhythm: sinus rhythm    ?  Ectopy: none   ?  Conduction: normal   ? ?Medications  ?levETIRAcetam (KEPPRA) IVPB 1500 mg/ 100 mL premix (0 mg Intravenous Stopped 12/13/21 0938)  ?lactated ringers bolus 1,000 mL (1,000 mLs Intravenous New Bag/Given 12/13/21 0938)  ? ? ? ?IMPRESSION / MDM / ASSESSMENT AND PLAN / ED COURSE  ?I reviewed the triage vital signs and the nursing notes. ? ?31 year old male with longstanding seizure disorder presents to the ED after a seizure at work of uncertain etiology and ultimately suitable for outpatient management.  He presents clearing from a postictal state.  Look systemically well and has no evidence of trauma, neurologic or vascular deficits.  Blood work with mild metabolic acidosis, likely a lactic acidosis if I were to check that value, due to his relative hypoperfusion in the setting of a seizure.  He received IV fluids for this.  Doubt DKA.  Normal CBC.  CXR is clear and his EKG is nonischemic.  He is observed for greater than 2 hours without recurrence of seizure.  He was loaded with IV Keppra and a levetiracetam level is pending.  He is well-established with neurology as an outpatient.  I considered observation admission for this patient, but ultimately do not think it is necessary.  We discussed following up with neurology, abstaining from driving and return precautions for the ED. ? ?Clinical Course as of 12/13/21 1030  ?Sat Dec 13, 2021  ?0944 Reassessed. Looks well. On the phone. No further seizures. Getting his Keppra [DS]  ?1020 Reassessed.  Feels well.  We discussed reassuring work-up.  We discussed continued Keppra dosing and we discussed return precautions for the ED.  We discussed following up closely with his neurologist and return precautions.  Discussed abstaining from driving for 6 months per [place] DMV protocols [DS]  ?  ?Clinical Course User Index ?[DS] Delton Prairie, MD  ? ? ? ?FINAL CLINICAL IMPRESSION(S) / ED DIAGNOSES  ? ?Final diagnoses:  ?Seizure (HCC)  ? ? ? ?Rx / DC Orders  ? ?ED  Discharge Orders   ? ? None  ? ?  ? ? ? ?Note:  This document was prepared using Dragon voice recognition software and may include unintentional dictation errors. ?  ?Delton Prairie, MD ?12/13/21 1030 ? ?

## 2021-12-13 NOTE — ED Triage Notes (Signed)
Seizure at work, loc, pt on keppra, seizure observed by coworkers, tonic clonic , pt axox4 now , blod glucose 141 ?

## 2021-12-13 NOTE — Discharge Instructions (Addendum)
Please continue your Keppra and follow-up with your neurologist. ? ?-Per [facility] statutes, patients with seizures are not allowed to drive until  they have been seizure-free for six months. Use caution when using heavy equipment or power tools. Avoid working on ladders or at heights. Take showers instead of baths. Ensure the water temperature is not too high on the home water heater. Do not go swimming alone. When caring for infants or small children, sit down when holding, feeding, or changing them to minimize risk of injury to the child in the event you have a seizure. Also, Maintain good sleep hygiene. Avoid alcohol. ? ?

## 2021-12-16 LAB — LEVETIRACETAM LEVEL: Levetiracetam Lvl: <2 ug/mL — ABNORMAL LOW (ref 10.0–40.0)

## 2022-07-01 ENCOUNTER — Emergency Department: Payer: Managed Care, Other (non HMO)

## 2022-07-01 ENCOUNTER — Other Ambulatory Visit: Payer: Self-pay

## 2022-07-01 ENCOUNTER — Emergency Department
Admission: EM | Admit: 2022-07-01 | Discharge: 2022-07-01 | Disposition: A | Discharge status: Home / Self Care | Payer: Managed Care, Other (non HMO) | Attending: Emergency Medicine | Admitting: Emergency Medicine

## 2022-07-01 DIAGNOSIS — W01198A Fall on same level from slipping, tripping and stumbling with subsequent striking against other object, initial encounter: Secondary | ICD-10-CM | POA: Insufficient documentation

## 2022-07-01 DIAGNOSIS — E1165 Type 2 diabetes mellitus with hyperglycemia: Secondary | ICD-10-CM | POA: Diagnosis not present

## 2022-07-01 DIAGNOSIS — Y990 Civilian activity done for income or pay: Secondary | ICD-10-CM | POA: Insufficient documentation

## 2022-07-01 DIAGNOSIS — Z7984 Long term (current) use of oral hypoglycemic drugs: Secondary | ICD-10-CM | POA: Insufficient documentation

## 2022-07-01 DIAGNOSIS — S0083XA Contusion of other part of head, initial encounter: Secondary | ICD-10-CM | POA: Insufficient documentation

## 2022-07-01 DIAGNOSIS — R569 Unspecified convulsions: Secondary | ICD-10-CM | POA: Insufficient documentation

## 2022-07-01 DIAGNOSIS — I10 Essential (primary) hypertension: Secondary | ICD-10-CM | POA: Insufficient documentation

## 2022-07-01 DIAGNOSIS — D72829 Elevated white blood cell count, unspecified: Secondary | ICD-10-CM | POA: Insufficient documentation

## 2022-07-01 DIAGNOSIS — Z794 Long term (current) use of insulin: Secondary | ICD-10-CM | POA: Diagnosis not present

## 2022-07-01 DIAGNOSIS — S0990XA Unspecified injury of head, initial encounter: Secondary | ICD-10-CM | POA: Diagnosis present

## 2022-07-01 LAB — CBC WITH DIFFERENTIAL/PLATELET
Abs Immature Granulocytes: 0.03 10*3/uL (ref 0.00–0.07)
Basophils Absolute: 0 10*3/uL (ref 0.0–0.1)
Basophils Relative: 0 %
Eosinophils Absolute: 0 10*3/uL (ref 0.0–0.5)
Eosinophils Relative: 0 %
HCT: 46.7 % (ref 39.0–52.0)
Hemoglobin: 15.9 g/dL (ref 13.0–17.0)
Immature Granulocytes: 0 %
Lymphocytes Relative: 14 %
Lymphs Abs: 1.5 10*3/uL (ref 0.7–4.0)
MCH: 27.4 pg (ref 26.0–34.0)
MCHC: 34 g/dL (ref 30.0–36.0)
MCV: 80.5 fL (ref 80.0–100.0)
Monocytes Absolute: 0.4 10*3/uL (ref 0.1–1.0)
Monocytes Relative: 4 %
Neutro Abs: 8.8 10*3/uL — ABNORMAL HIGH (ref 1.7–7.7)
Neutrophils Relative %: 82 %
Platelets: 175 10*3/uL (ref 150–400)
RBC: 5.8 MIL/uL (ref 4.22–5.81)
RDW: 12.8 % (ref 11.5–15.5)
WBC: 10.7 10*3/uL — ABNORMAL HIGH (ref 4.0–10.5)
nRBC: 0 % (ref 0.0–0.2)

## 2022-07-01 LAB — COMPREHENSIVE METABOLIC PANEL
ALT: 58 U/L — ABNORMAL HIGH (ref 0–44)
AST: 36 U/L (ref 15–41)
Albumin: 4.7 g/dL (ref 3.5–5.0)
Alkaline Phosphatase: 55 U/L (ref 38–126)
Anion gap: 7 (ref 5–15)
BUN: 19 mg/dL (ref 6–20)
CO2: 24 mmol/L (ref 22–32)
Calcium: 9.8 mg/dL (ref 8.9–10.3)
Chloride: 109 mmol/L (ref 98–111)
Creatinine, Ser: 1.06 mg/dL (ref 0.61–1.24)
GFR, Estimated: >60 mL/min (ref >60)
Glucose, Bld: 363 mg/dL — ABNORMAL HIGH (ref 70–99)
Potassium: 4 mmol/L (ref 3.5–5.1)
Sodium: 140 mmol/L (ref 135–145)
Total Bilirubin: 0.8 mg/dL (ref 0.3–1.2)
Total Protein: 7.6 g/dL (ref 6.5–8.1)

## 2022-07-01 LAB — URINALYSIS, ROUTINE W REFLEX MICROSCOPIC
Bacteria, UA: NONE SEEN
Bilirubin Urine: NEGATIVE
Glucose, UA: >=500 mg/dL — AB
Ketones, ur: 20 mg/dL — AB
Leukocytes,Ua: NEGATIVE
Nitrite: NEGATIVE
Protein, ur: 100 mg/dL — AB
Specific Gravity, Urine: 1.035 — ABNORMAL HIGH (ref 1.005–1.030)
Squamous Epithelial / HPF: NONE SEEN (ref 0–5)
pH: 5 (ref 5.0–8.0)

## 2022-07-01 LAB — URINE DRUG SCREEN, QUALITATIVE (ARMC ONLY)
Amphetamines, Ur Screen: NOT DETECTED
Barbiturates, Ur Screen: NOT DETECTED
Benzodiazepine, Ur Scrn: NOT DETECTED
Cannabinoid 50 Ng, Ur ~~LOC~~: NOT DETECTED
Cocaine Metabolite,Ur ~~LOC~~: NOT DETECTED
MDMA (Ecstasy)Ur Screen: NOT DETECTED
Methadone Scn, Ur: NOT DETECTED
Opiate, Ur Screen: NOT DETECTED
Phencyclidine (PCP) Ur S: NOT DETECTED
Tricyclic, Ur Screen: NOT DETECTED

## 2022-07-01 LAB — ETHANOL: Alcohol, Ethyl (B): <10 mg/dL (ref <10)

## 2022-07-01 MED ORDER — LEVETIRACETAM IN NACL 1000 MG/100ML IV SOLN
1000.0000 mg | Freq: Once | INTRAVENOUS | Status: AC
  Administered 2022-07-01: 1000 mg via INTRAVENOUS
  Filled 2022-07-01: qty 100

## 2022-07-01 MED ORDER — LEVETIRACETAM 750 MG PO TABS: 750.0000 mg | ORAL_TABLET | Freq: Two times a day (BID) | ORAL | 1 refills | Status: AC
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
## 2022-07-01 NOTE — Discharge Instructions (Addendum)
No driving for 6 months after most recent seizure or spell, per [place] law.   Avoid activities that are potentially dangerous if you were to have another seizure, including operating heavy machinery, swimming, taking baths, climbing heights. Please use direct supervision around stoves, ovens, fireplaces, campfires, or other sources of heat or fire.    Make sure you are taking medications as directed, avoiding alcohol and drug use, getting adequate sleep and eating regular meals.   

## 2022-09-18 ENCOUNTER — Emergency Department
Admission: EM | Admit: 2022-09-18 | Discharge: 2022-09-18 | Disposition: A | Discharge status: Home / Self Care | Payer: Managed Care, Other (non HMO) | Attending: Emergency Medicine | Admitting: Emergency Medicine

## 2022-09-18 ENCOUNTER — Other Ambulatory Visit: Payer: Self-pay

## 2022-09-18 DIAGNOSIS — R739 Hyperglycemia, unspecified: Secondary | ICD-10-CM

## 2022-09-18 DIAGNOSIS — Z7984 Long term (current) use of oral hypoglycemic drugs: Secondary | ICD-10-CM | POA: Diagnosis not present

## 2022-09-18 LAB — BASIC METABOLIC PANEL
Anion gap: 11 (ref 5–15)
BUN: 10 mg/dL (ref 6–20)
CO2: 23 mmol/L (ref 22–32)
Calcium: 9.3 mg/dL (ref 8.9–10.3)
Chloride: 99 mmol/L (ref 98–111)
Creatinine, Ser: 0.94 mg/dL (ref 0.61–1.24)
GFR, Estimated: >60 mL/min (ref >60)
Glucose, Bld: 648 mg/dL (ref 70–99)
Potassium: 4.1 mmol/L (ref 3.5–5.1)
Sodium: 133 mmol/L — ABNORMAL LOW (ref 135–145)

## 2022-09-18 LAB — CBG MONITORING, ED
Glucose-Capillary: 575 mg/dL (ref 70–99)
Glucose-Capillary: >600 mg/dL (ref 70–99)
Glucose-Capillary: 289 mg/dL — ABNORMAL HIGH (ref 70–99)

## 2022-09-18 LAB — URINALYSIS, ROUTINE W REFLEX MICROSCOPIC
Bacteria, UA: NONE SEEN
Bilirubin Urine: NEGATIVE
Glucose, UA: >=500 mg/dL — AB
Hgb urine dipstick: NEGATIVE
Ketones, ur: NEGATIVE mg/dL
Leukocytes,Ua: NEGATIVE
Nitrite: NEGATIVE
Protein, ur: NEGATIVE mg/dL
Specific Gravity, Urine: 1.03 (ref 1.005–1.030)
Squamous Epithelial / HPF: NONE SEEN /HPF (ref 0–5)
WBC, UA: NONE SEEN WBC/hpf (ref 0–5)
pH: 6 (ref 5.0–8.0)

## 2022-09-18 LAB — CBC
HCT: 44.3 % (ref 39.0–52.0)
Hemoglobin: 15 g/dL (ref 13.0–17.0)
MCH: 27.3 pg (ref 26.0–34.0)
MCHC: 33.9 g/dL (ref 30.0–36.0)
MCV: 80.7 fL (ref 80.0–100.0)
Platelets: 163 10*3/uL (ref 150–400)
RBC: 5.49 MIL/uL (ref 4.22–5.81)
RDW: 12.7 % (ref 11.5–15.5)
WBC: 5.8 10*3/uL (ref 4.0–10.5)
nRBC: 0 % (ref 0.0–0.2)

## 2022-09-18 MED ORDER — SODIUM CHLORIDE 0.9 % IV BOLUS
1000.0000 mL | Freq: Once | INTRAVENOUS | Status: AC
  Administered 2022-09-18: 1000 mL via INTRAVENOUS

## 2022-09-18 MED ORDER — INSULIN ASPART 100 UNIT/ML IJ SOLN
10.0000 [IU] | Freq: Once | INTRAMUSCULAR | Status: AC
  Administered 2022-09-18: 10 [IU] via INTRAVENOUS
  Filled 2022-09-18: qty 1

## 2022-09-18 NOTE — ED Notes (Signed)
Pt given discharge instructions. Pt voiced understanding of instructions. Pt unable to sign due to there being no signing pad in room.

## 2022-09-18 NOTE — ED Triage Notes (Signed)
Pt here with hyperglycemia. Pt went to get labs drawn today and was told to come to the ED due to his blood sugar.

## 2022-09-18 NOTE — Discharge Instructions (Signed)
Please seek medical attention for any high fevers, chest pain, shortness of breath, change in behavior, persistent vomiting, bloody stool or any other new or concerning symptoms.  

## 2022-09-18 NOTE — ED Notes (Signed)
Pt CBG reading "HI" RN notified

## 2022-09-18 NOTE — ED Provider Notes (Signed)
[facility] Provider Note    Event Date/Time   First MD Initiated Contact with Patient 09/18/22 1527     (approximate)   History   Hyperglycemia   HPI  Benjamin Richardson is a 32 y.o. male  who presents to the emergency department today because of concerns for high blood sugar.  Patient was discovered on blood work that was performed earlier today.  He says that he has been feeling well recently.  He has not felt like his blood sugars been running high.  Normally his blood sugar is "okay".  He is on metformin and denies missing any recent doses. Denies any recent illness. No fevers, nausea or vomiting.      Physical Exam   Triage Vital Signs: ED Triage Vitals  Enc Vitals Group     BP 09/18/22 1435 (!) 136/93     Pulse Rate 09/18/22 1435 83     Resp 09/18/22 1435 18     Temp 09/18/22 1435 97.7 F (36.5 C)     Temp Source 09/18/22 1435 Oral     SpO2 09/18/22 1435 99 %     Weight 09/18/22 1436 264 lb 8.8 oz (120 kg)     Height 09/18/22 1436 5\' 10"  (1.778 m)     Head Circumference --      Peak Flow --      Pain Score 09/18/22 1436 0     Pain Loc --      Pain Edu? --      Excl. in [place]? --     Most recent vital signs: Vitals:   09/18/22 1435  BP: (!) 136/93  Pulse: 83  Resp: 18  Temp: 97.7 F (36.5 C)  SpO2: 99%   General: Awake, alert, oriented. CV:  Good peripheral perfusion. Regular rate and rhythm. Resp:  Normal effort. Lungs clear. Abd:  No distention. Non tender.   ED Results / Procedures / Treatments   Labs (all labs ordered are listed, but only abnormal results are displayed) Labs Reviewed  BASIC METABOLIC PANEL - Abnormal; Notable for the following components:      Result Value   Sodium 133 (*)    Glucose, Bld 648 (*)    All other components within normal limits  URINALYSIS, ROUTINE W REFLEX MICROSCOPIC - Abnormal; Notable for the following components:   Color, Urine COLORLESS (*)    APPearance CLEAR (*)    Glucose, UA  >=500 (*)    All other components within normal limits  CBG MONITORING, ED - Abnormal; Notable for the following components:   Glucose-Capillary 575 (*)    All other components within normal limits  CBG MONITORING, ED - Abnormal; Notable for the following components:   Glucose-Capillary >600 (*)    All other components within normal limits  CBG MONITORING, ED - Abnormal; Notable for the following components:   Glucose-Capillary 289 (*)    All other components within normal limits  CBC     EKG  None   RADIOLOGY None   PROCEDURES:  Critical Care performed: No  Procedures   MEDICATIONS ORDERED IN ED: Medications  sodium chloride 0.9 % bolus 1,000 mL (1,000 mLs Intravenous New Bag/Given 09/18/22 1442)     IMPRESSION / MDM / ASSESSMENT AND PLAN / ED COURSE  I reviewed the triage vital signs and the nursing notes.  Differential diagnosis includes, but is not limited to, DKA, hyperglycemia  Patient's presentation is most consistent with acute presentation with potential threat to life or bodily function.  Patient presented to the emergency department today because of concerns for high blood sugar.  Patient does blood sugar is elevated here.  However no elevated anion gap.  No ketones in urine.  At this time I have low suspicion for DKA.  Patient's blood sugar did improve with IV fluids and insulin.  Patient continues to be asymptomatic.  At this time I think is reasonable for patient to be discharged.  Discussed with patient that he could increase his metformin dose however he does have an appointment with his primary care in a few days I feel would be reasonable for him to discuss it with them at that time.   FINAL CLINICAL IMPRESSION(S) / ED DIAGNOSES   Final diagnoses:  Hyperglycemia      Note:  This document was prepared using Dragon voice recognition software and may include unintentional dictation errors.    Nance Pear,  MD 09/18/22 Joen Laura

## 2022-09-23 ENCOUNTER — Ambulatory Visit: Payer: Managed Care, Other (non HMO) | Admitting: Nurse Practitioner

## 2022-09-25 ENCOUNTER — Ambulatory Visit: Payer: Managed Care, Other (non HMO) | Admitting: Nurse Practitioner

## 2022-10-06 ENCOUNTER — Ambulatory Visit (INDEPENDENT_AMBULATORY_CARE_PROVIDER_SITE_OTHER): Payer: Managed Care, Other (non HMO) | Admitting: Internal Medicine

## 2022-10-06 ENCOUNTER — Encounter: Payer: Self-pay | Admitting: Internal Medicine

## 2022-10-06 VITALS — BP 128/60 | HR 90 | Ht 73.0 in | Wt 234.0 lb

## 2022-10-06 DIAGNOSIS — E1165 Type 2 diabetes mellitus with hyperglycemia: Secondary | ICD-10-CM | POA: Diagnosis not present

## 2022-10-06 DIAGNOSIS — E1100 Type 2 diabetes mellitus with hyperosmolarity without nonketotic hyperglycemic-hyperosmolar coma (NKHHC): Secondary | ICD-10-CM | POA: Insufficient documentation

## 2022-10-06 DIAGNOSIS — E781 Pure hyperglyceridemia: Secondary | ICD-10-CM

## 2022-10-06 DIAGNOSIS — E559 Vitamin D deficiency, unspecified: Secondary | ICD-10-CM

## 2022-10-06 DIAGNOSIS — G40909 Epilepsy, unspecified, not intractable, without status epilepticus: Secondary | ICD-10-CM | POA: Diagnosis not present

## 2022-10-06 LAB — POCT CBG (FASTING - GLUCOSE)-MANUAL ENTRY: Glucose Fasting, POC: 582 mg/dL — AB (ref 70–99)

## 2022-10-06 NOTE — Progress Notes (Signed)
Established Patient Office Visit  Subjective:  Patient ID: Benjamin Richardson, male    DOB: 11/06/1990  Age: 32 y.o. MRN: [id number]  Chief Complaint  Patient presents with   Follow-up    Follow up regarding paperwork    Patient comes in today with DMV forms for his driver's license. Patient has not been in this office since September 30, 2021.  Since then he has been to the emergency room twice for a seizure episode and once for uncontrolled diabetes.   The most recent seizure episode was on July 01, 2022.  He has seen his neurologist for a follow-up in February and his dose of Keppra was increased at that time.  As per the neurology notes he is not allowed to drive for 6 months since his most recent seizure which was in November. So patient is not clear to drive for another 3 months.  Patient understands that and is willing to wait.  From his DMV paperwork , the  neurology part was given so that he can take it to  his neurologist to fill out. Patient has not seen an ophthalmologist for his diabetic eye exam, he has been advised to set up an appointment .  Today patient's glucose is still at 582.  He has NovoLog sliding scale insulin to take but he says he had a very big breakfast with pancakes and fruit punch and forgot to give himself the NovoLog.   Seems like patient has poor understanding about his health issues. He will be come back for fasting labs . Consider setting up with Endocrinologist.     Past Medical History:  Diagnosis Date   DM hyperosmolarity type II, uncontrolled (HCC)    Elevated blood pressure reading without diagnosis of hypertension    Isolated hypertriglyceridemia    Obesity, Class I, BMI 30-34.9    Seizure (HCC)     Social History   Socioeconomic History   Marital status: Single    Spouse name: Not on file   Number of children: Not on file   Years of education: Not on file   Highest education level: Not on file  Occupational History   Not on file   Tobacco Use   Smoking status: Never   Smokeless tobacco: Never  Substance and Sexual Activity   Alcohol use: Yes    Alcohol/week: 0.0 standard drinks of alcohol    Comment: ocassionally   Drug use: No   Sexual activity: Never  Other Topics Concern   Not on file  Social History Narrative   Not on file   Social Determinants of Health   Financial Resource Strain: Not on file  Food Insecurity: Not on file  Transportation Needs: Not on file  Physical Activity: Not on file  Stress: Not on file  Social Connections: Not on file  Intimate Partner Violence: Not on file    Family History  Problem Relation Age of Onset   Diabetes Mother     No Known Allergies  Review of Systems  Constitutional: Negative.   HENT: Negative.    Eyes: Negative.   Respiratory: Negative.    Cardiovascular: Negative.   Gastrointestinal: Negative.   Genitourinary: Negative.   Musculoskeletal: Negative.   Neurological: Negative.   Endo/Heme/Allergies: Negative.   Psychiatric/Behavioral: Negative.         Objective:   BP 128/60   Pulse 90   Ht 6' 1"$  (1.854 m)   Wt 234 lb (106.1 kg)   SpO2 97%  BMI 30.87 kg/m   Vitals:   09/30/21 1450 10/06/22 1449  BP: 112/68 128/60  Pulse: 78 90  Height: 6' 1"$  (1.854 m) 6' 1"$  (1.854 m)  Weight: 263 lb (119.3 kg) 234 lb (106.1 kg)  SpO2: 98% 97%  BMI (Calculated): 34.71 30.88    Physical Exam Vitals and nursing note reviewed.  Constitutional:      Appearance: Normal appearance.  Cardiovascular:     Rate and Rhythm: Normal rate and regular rhythm.  Pulmonary:     Effort: Pulmonary effort is normal.     Breath sounds: Normal breath sounds.  Neurological:     General: No focal deficit present.     Mental Status: He is alert and oriented to person, place, and time.      Results for orders placed or performed in visit on 10/06/22  POCT CBG (Fasting - Glucose)  Result Value Ref Range   Glucose Fasting, POC 582 (A) 70 - 99 mg/dL     Recent Results (from the past 2160 hour(s))  CBG monitoring, ED     Status: Abnormal   Collection Time: 09/18/22  2:35 PM  Result Value Ref Range   Glucose-Capillary 575 (HH) 70 - 99 mg/dL    Comment: Glucose reference range applies only to samples taken after fasting for at least 8 hours.  Basic metabolic panel     Status: Abnormal   Collection Time: 09/18/22  2:41 PM  Result Value Ref Range   Sodium 133 (L) 135 - 145 mmol/L   Potassium 4.1 3.5 - 5.1 mmol/L   Chloride 99 98 - 111 mmol/L   CO2 23 22 - 32 mmol/L   Glucose, Bld 648 (HH) 70 - 99 mg/dL    Comment: CRITICAL RESULT CALLED TO, READ BACK BY AND VERIFIED WITH KELLY WILLIAMS 09/18/22 @ 1512 BY SB Glucose reference range applies only to samples taken after fasting for at least 8 hours.    BUN 10 6 - 20 mg/dL   Creatinine, Ser 0.94 0.61 - 1.24 mg/dL   Calcium 9.3 8.9 - 10.3 mg/dL   GFR, Estimated >60 >60 mL/min    Comment: (NOTE) Calculated using the CKD-EPI Creatinine Equation (2021)    Anion gap 11 5 - 15    Comment: Performed at [facility], [address] [place], [place] [address]  CBC     Status: None   Collection Time: 09/18/22  2:41 PM  Result Value Ref Range   WBC 5.8 4.0 - 10.5 K/uL   RBC 5.49 4.22 - 5.81 MIL/uL   Hemoglobin 15.0 13.0 - 17.0 g/dL   HCT 44.3 39.0 - 52.0 %   MCV 80.7 80.0 - 100.0 fL   MCH 27.3 26.0 - 34.0 pg   MCHC 33.9 30.0 - 36.0 g/dL   RDW 12.7 11.5 - 15.5 %   Platelets 163 150 - 400 K/uL   nRBC 0.0 0.0 - 0.2 %    Comment: Performed at [facility], [address] [place], [place] [address]  Urinalysis, Routine w reflex microscopic -Urine, Clean Catch     Status: Abnormal   Collection Time: 09/18/22  2:42 PM  Result Value Ref Range   Color, Urine COLORLESS (A) YELLOW   APPearance CLEAR (A) CLEAR   Specific Gravity, Urine 1.030 1.005 - 1.030   pH 6.0 5.0 - 8.0   Glucose, UA >=500 (A) NEGATIVE mg/dL   Hgb urine dipstick NEGATIVE NEGATIVE   Bilirubin Urine  NEGATIVE NEGATIVE   Ketones,  ur NEGATIVE NEGATIVE mg/dL   Protein, ur NEGATIVE NEGATIVE mg/dL   Nitrite NEGATIVE NEGATIVE   Leukocytes,Ua NEGATIVE NEGATIVE   RBC / HPF 0-5 0 - 5 RBC/hpf   WBC, UA NONE SEEN 0 - 5 WBC/hpf   Bacteria, UA NONE SEEN NONE SEEN   Squamous Epithelial / HPF NONE SEEN 0 - 5 /HPF    Comment: Performed at [facility], [address] [place], [place] [address]  CBG monitoring, ED     Status: Abnormal   Collection Time: 09/18/22  4:10 PM  Result Value Ref Range   Glucose-Capillary >600 (HH) 70 - 99 mg/dL    Comment: Glucose reference range applies only to samples taken after fasting for at least 8 hours.  CBG monitoring, ED     Status: Abnormal   Collection Time: 09/18/22  6:01 PM  Result Value Ref Range   Glucose-Capillary 289 (H) 70 - 99 mg/dL    Comment: Glucose reference range applies only to samples taken after fasting for at least 8 hours.  POCT CBG (Fasting - Glucose)     Status: Abnormal   Collection Time: 10/06/22  3:01 PM  Result Value Ref Range   Glucose Fasting, POC 582 (A) 70 - 99 mg/dL      Assessment & Plan:  Patient to return fasting for his blood work.  He was advised to follow strict diet and to use his medications regularly.  Patient has a follow-up with his neurologist for his seizure control next month. Problem List Items Addressed This Visit     Hypertriglyceridemia   Relevant Orders   CMP14+EGFR   Lipid Panel w/o Chol/HDL Ratio   Pre-diabetes   Seizure disorder ([place])   DM hyperosmolarity type II, uncontrolled ([place]) - Primary   Relevant Orders   CMP14+EGFR   Other Visit Diagnoses     Vitamin D deficiency       Relevant Orders   Vitamin D (25 hydroxy)       Return in about 3 months (around 01/04/2023).   Total time spent: 30 minutes  Perrin Maltese, MD  10/06/2022

## 2022-10-07 ENCOUNTER — Other Ambulatory Visit: Payer: Managed Care, Other (non HMO)

## 2022-10-08 LAB — CMP14+EGFR
ALT: 29 IU/L (ref 0–44)
AST: 22 IU/L (ref 0–40)
Albumin/Globulin Ratio: 2.5 — ABNORMAL HIGH (ref 1.2–2.2)
Albumin: 4.8 g/dL (ref 4.1–5.1)
Alkaline Phosphatase: 60 IU/L (ref 44–121)
BUN/Creatinine Ratio: 11 (ref 9–20)
BUN: 11 mg/dL (ref 6–20)
Bilirubin Total: 0.4 mg/dL (ref 0.0–1.2)
CO2: 23 mmol/L (ref 20–29)
Calcium: 10.2 mg/dL (ref 8.7–10.2)
Chloride: 98 mmol/L (ref 96–106)
Creatinine, Ser: 1.02 mg/dL (ref 0.76–1.27)
Globulin, Total: 1.9 g/dL (ref 1.5–4.5)
Glucose: 143 mg/dL — ABNORMAL HIGH (ref 70–99)
Potassium: 4.3 mmol/L (ref 3.5–5.2)
Sodium: 139 mmol/L (ref 134–144)
Total Protein: 6.7 g/dL (ref 6.0–8.5)
eGFR: 101 mL/min/{1.73_m2} (ref >59)

## 2022-10-08 LAB — LIPID PANEL W/O CHOL/HDL RATIO
Cholesterol, Total: 168 mg/dL (ref 100–199)
HDL: 63 mg/dL (ref >39)
LDL Chol Calc (NIH): 93 mg/dL (ref 0–99)
Triglycerides: 64 mg/dL (ref 0–149)
VLDL Cholesterol Cal: 12 mg/dL (ref 5–40)

## 2022-10-08 LAB — HEMOGLOBIN A1C
Est. average glucose Bld gHb Est-mCnc: 375 mg/dL
Hgb A1c MFr Bld: 14.7 % — ABNORMAL HIGH (ref 4.8–5.6)

## 2022-10-08 LAB — VITAMIN D 25 HYDROXY (VIT D DEFICIENCY, FRACTURES): Vit D, 25-Hydroxy: 26.3 ng/mL — ABNORMAL LOW (ref 30.0–100.0)

## 2023-01-04 ENCOUNTER — Ambulatory Visit: Payer: Managed Care, Other (non HMO) | Admitting: Nurse Practitioner

## 2023-01-11 ENCOUNTER — Encounter: Payer: Self-pay | Admitting: Nurse Practitioner

## 2023-01-11 ENCOUNTER — Ambulatory Visit (INDEPENDENT_AMBULATORY_CARE_PROVIDER_SITE_OTHER): Payer: Managed Care, Other (non HMO) | Admitting: Nurse Practitioner

## 2023-01-11 VITALS — BP 120/90 | HR 79 | Ht 74.0 in | Wt 223.0 lb

## 2023-01-11 DIAGNOSIS — E781 Pure hyperglyceridemia: Secondary | ICD-10-CM | POA: Diagnosis not present

## 2023-01-11 DIAGNOSIS — E1100 Type 2 diabetes mellitus with hyperosmolarity without nonketotic hyperglycemic-hyperosmolar coma (NKHHC): Secondary | ICD-10-CM | POA: Diagnosis not present

## 2023-01-11 DIAGNOSIS — I10 Essential (primary) hypertension: Secondary | ICD-10-CM | POA: Diagnosis not present

## 2023-01-11 NOTE — Progress Notes (Signed)
Established Patient Office Visit  Subjective:  Patient ID: Savan Carreau, male    DOB: [birth year]  Age: 32 y.o. MRN: [id number]  Chief Complaint  Patient presents with   Follow-up    3 month follow up    3 month follow up, patient did not have fasting labs done prior to this appt.  Patient will return next week for fasting labs.  Patient will schedule eye appt for diabetic eye exam.  Neg ROS.    No other concerns at this time.   Past Medical History:  Diagnosis Date   DM hyperosmolarity type II, uncontrolled (HCC)    Elevated blood pressure reading without diagnosis of hypertension    Isolated hypertriglyceridemia    Obesity, Class I, BMI 30-34.9    Seizure (HCC)     No past surgical history on file.  Social History   Socioeconomic History   Marital status: Single    Spouse name: Not on file   Number of children: Not on file   Years of education: Not on file   Highest education level: Not on file  Occupational History   Not on file  Tobacco Use   Smoking status: Never   Smokeless tobacco: Never  Substance and Sexual Activity   Alcohol use: Yes    Alcohol/week: 0.0 standard drinks of alcohol    Comment: ocassionally   Drug use: No   Sexual activity: Never  Other Topics Concern   Not on file  Social History Narrative   Not on file   Social Determinants of Health   Financial Resource Strain: Not on file  Food Insecurity: Not on file  Transportation Needs: Not on file  Physical Activity: Not on file  Stress: Not on file  Social Connections: Not on file  Intimate Partner Violence: Not on file    Family History  Problem Relation Age of Onset   Diabetes Mother     No Known Allergies  Review of Systems  Constitutional: Negative.   HENT: Negative.    Eyes: Negative.   Respiratory: Negative.    Cardiovascular: Negative.   Gastrointestinal: Negative.   Genitourinary: Negative.   Musculoskeletal: Negative.   Skin: Negative.   Neurological:  Negative.   Endo/Heme/Allergies: Negative.   Psychiatric/Behavioral: Negative.         Objective:   BP (!) 120/90   Pulse 79   Ht 6\' 2"  (1.88 m)   Wt 223 lb (101.2 kg)   SpO2 96%   BMI 28.63 kg/m   Vitals:   01/11/23 1117  BP: (!) 120/90  Pulse: 79  Height: 6\' 2"  (1.88 m)  Weight: 223 lb (101.2 kg)  SpO2: 96%  BMI (Calculated): 28.62    Physical Exam Vitals and nursing note reviewed.  Constitutional:      Appearance: Normal appearance.  HENT:     Head: Normocephalic.     Nose: Nose normal.     Mouth/Throat:     Mouth: Mucous membranes are moist.  Eyes:     Pupils: Pupils are equal, round, and reactive to light.  Cardiovascular:     Rate and Rhythm: Normal rate and regular rhythm.  Pulmonary:     Effort: Pulmonary effort is normal.     Breath sounds: Normal breath sounds.  Abdominal:     General: Bowel sounds are normal.     Palpations: Abdomen is soft.  Musculoskeletal:        General: Normal range of motion.     Cervical back:  Normal range of motion and neck supple.  Skin:    General: Skin is warm and dry.  Neurological:     Mental Status: He is alert and oriented to person, place, and time.  Psychiatric:        Mood and Affect: Mood normal.        Behavior: Behavior normal.      No results found for any visits on 01/11/23.      Assessment & Plan:  1) Return for fasting labs next week, will send My Chart message 2)  Follow up appt in 4 months, fasting labs prior if possible Problem List Items Addressed This Visit       Cardiovascular and Mediastinum   Essential hypertension - Primary     Endocrine   DM hyperosmolarity type II, uncontrolled (HCC)     Other   Hypertriglyceridemia    Return in about 4 months (around 05/14/2023) for Follow up appt in 4 months, fasting labs prior .   Total time spent: 35 minutes  Orson Eva, NP  01/11/2023   This document may have been prepared by [facility] Voice Recognition software and as such  may include unintentional dictation errors.

## 2023-03-23 ENCOUNTER — Emergency Department: Payer: Managed Care, Other (non HMO)

## 2023-03-23 ENCOUNTER — Emergency Department
Admission: EM | Admit: 2023-03-23 | Discharge: 2023-03-23 | Disposition: A | Discharge status: Home / Self Care | Payer: Managed Care, Other (non HMO) | Attending: Emergency Medicine | Admitting: Emergency Medicine

## 2023-03-23 ENCOUNTER — Other Ambulatory Visit: Payer: Self-pay

## 2023-03-23 DIAGNOSIS — S0282XA Fracture of other specified skull and facial bones, left side, initial encounter for closed fracture: Secondary | ICD-10-CM | POA: Diagnosis not present

## 2023-03-23 DIAGNOSIS — R569 Unspecified convulsions: Secondary | ICD-10-CM

## 2023-03-23 DIAGNOSIS — I10 Essential (primary) hypertension: Secondary | ICD-10-CM | POA: Diagnosis not present

## 2023-03-23 DIAGNOSIS — S020XXA Fracture of vault of skull, initial encounter for closed fracture: Secondary | ICD-10-CM | POA: Diagnosis not present

## 2023-03-23 DIAGNOSIS — E119 Type 2 diabetes mellitus without complications: Secondary | ICD-10-CM | POA: Diagnosis not present

## 2023-03-23 DIAGNOSIS — S0990XA Unspecified injury of head, initial encounter: Secondary | ICD-10-CM | POA: Diagnosis present

## 2023-03-23 DIAGNOSIS — W19XXXA Unspecified fall, initial encounter: Secondary | ICD-10-CM | POA: Insufficient documentation

## 2023-03-23 DIAGNOSIS — G40909 Epilepsy, unspecified, not intractable, without status epilepticus: Secondary | ICD-10-CM | POA: Diagnosis not present

## 2023-03-23 DIAGNOSIS — R739 Hyperglycemia, unspecified: Secondary | ICD-10-CM

## 2023-03-23 DIAGNOSIS — S0291XA Unspecified fracture of skull, initial encounter for closed fracture: Secondary | ICD-10-CM | POA: Insufficient documentation

## 2023-03-23 LAB — URINALYSIS, ROUTINE W REFLEX MICROSCOPIC
Bacteria, UA: NONE SEEN
Bilirubin Urine: NEGATIVE
Glucose, UA: >=500 mg/dL — AB
Hgb urine dipstick: NEGATIVE
Ketones, ur: NEGATIVE mg/dL
Leukocytes,Ua: NEGATIVE
Nitrite: NEGATIVE
Protein, ur: NEGATIVE mg/dL
Specific Gravity, Urine: 1.03 (ref 1.005–1.030)
WBC, UA: NONE SEEN WBC/hpf (ref 0–5)
pH: 5 (ref 5.0–8.0)

## 2023-03-23 LAB — CBC WITH DIFFERENTIAL/PLATELET
Abs Immature Granulocytes: 0.03 10*3/uL (ref 0.00–0.07)
Basophils Absolute: 0 10*3/uL (ref 0.0–0.1)
Basophils Relative: 0 %
Eosinophils Absolute: 0 10*3/uL (ref 0.0–0.5)
Eosinophils Relative: 1 %
HCT: 44.9 % (ref 39.0–52.0)
Hemoglobin: 14.4 g/dL (ref 13.0–17.0)
Immature Granulocytes: 1 %
Lymphocytes Relative: 38 %
Lymphs Abs: 2.5 10*3/uL (ref 0.7–4.0)
MCH: 27.4 pg (ref 26.0–34.0)
MCHC: 32.1 g/dL (ref 30.0–36.0)
MCV: 85.5 fL (ref 80.0–100.0)
Monocytes Absolute: 0.5 10*3/uL (ref 0.1–1.0)
Monocytes Relative: 7 %
Neutro Abs: 3.6 10*3/uL (ref 1.7–7.7)
Neutrophils Relative %: 53 %
Platelets: 101 10*3/uL — ABNORMAL LOW (ref 150–400)
RBC: 5.25 MIL/uL (ref 4.22–5.81)
RDW: 12.3 % (ref 11.5–15.5)
WBC: 6.6 10*3/uL (ref 4.0–10.5)
nRBC: 0 % (ref 0.0–0.2)

## 2023-03-23 LAB — BASIC METABOLIC PANEL
Anion gap: 8 (ref 5–15)
BUN: 10 mg/dL (ref 6–20)
CO2: 23 mmol/L (ref 22–32)
Calcium: 8.3 mg/dL — ABNORMAL LOW (ref 8.9–10.3)
Chloride: 104 mmol/L (ref 98–111)
Creatinine, Ser: 0.87 mg/dL (ref 0.61–1.24)
GFR, Estimated: >60 mL/min (ref >60)
Glucose, Bld: 401 mg/dL — ABNORMAL HIGH (ref 70–99)
Potassium: 3.8 mmol/L (ref 3.5–5.1)
Sodium: 135 mmol/L (ref 135–145)

## 2023-03-23 LAB — CBG MONITORING, ED: Glucose-Capillary: 218 mg/dL — ABNORMAL HIGH (ref 70–99)

## 2023-03-23 MED ORDER — LACTATED RINGERS IV BOLUS
1000.0000 mL | Freq: Once | INTRAVENOUS | Status: AC
  Administered 2023-03-23: 1000 mL via INTRAVENOUS

## 2023-03-23 MED ORDER — LEVETIRACETAM IN NACL 1500 MG/100ML IV SOLN
1500.0000 mg | Freq: Once | INTRAVENOUS | Status: AC
  Administered 2023-03-23: 1500 mg via INTRAVENOUS
  Filled 2023-03-23: qty 100

## 2023-03-23 NOTE — ED Provider Notes (Addendum)
Care assumed of patient from outgoing provider.  See their note for initial history, exam and plan.  Clinical Course as of 03/23/23 0729  Tue Mar 23, 2023  0707 Patient signed out to oncoming provider pending remainder of workup [DS]  0728 Past medical history significant for seizure disorder and on Keppra, compliant with Keppra.  Witnessed seizure today at work and had a fall forward hitting his face.  Patient received IV Keppra.  Is now back to baseline and has a nonfocal neurologic exam.  CT scan of the head with concern for frontal bone fracture.  Consulted neurosurgery who will evaluate the patient in the emergency department.  Plan to discharge home with ongoing Keppra use and plan to reengage with neurology for further seizure medication changes. [SM]    Clinical Course User Index [DS] Delton Prairie, MD [SM] Corena Herter, MD   Neurosurgeon Dr. Katrinka Blazing evaluated the patient in the emergency department.  Did not feel that the patient needed neurosurgical follow-up as an outpatient.  Recommended follow-up with neurology.  Initial glucose elevated in the 400s, received 1 L of IV fluids and repeat glucose in the 200s.  Discussed return with neurology and primary care provider for further management of hyperglycemia and diabetes.  Discussed follow-up with neurology and discussed seizure precautions.  Corena Herter, MD 03/23/23 1610    Corena Herter, MD 03/23/23 1032

## 2023-03-23 NOTE — Consult Note (Signed)
Consulting Department:  Emergency Dept, Dr Katrinka Blazing  Primary Physician:  Orson Eva, NP  Chief Complaint:  Skull Fracture  History of Present Illness: 03/23/2023 Benjamin Richardson is a 32 y.o. male who presents with the chief complaint of a skull fracture sustained after a fall from a witnessed tonic clonic seizure.  He lost consciousness prior to falling.  He woke up with no sustained deficits.  He does have an abrasion on his left forehead but no active leaking.  Denies any fluid leaking from his nose or ears.  Not having any new continued seizures.  No numbness weakness or tingling.  Does not have any neck pain.  Otherwise feels that he has returned to his baseline.  Not having any eye pain or difficulty with restricted eye movements.  Review of Systems:  A 10 point review of systems is negative, except for the pertinent positives and negatives detailed in the HPI.  Past Medical History: Past Medical History:  Diagnosis Date   DM hyperosmolarity type II, uncontrolled (HCC)    Elevated blood pressure reading without diagnosis of hypertension    Isolated hypertriglyceridemia    Obesity, Class I, BMI 30-34.9    Seizure (HCC)     Past Surgical History: History reviewed. No pertinent surgical history.  Allergies: Allergies as of 03/23/2023   (No Known Allergies)    Medications: No current facility-administered medications for this encounter.  Current Outpatient Medications:    Calcium Carb-Cholecalciferol 500-10 MG-MCG TABS, Take 1 tablet by mouth every 7 (seven) days., Disp: , Rfl:    levETIRAcetam (KEPPRA) 750 MG tablet, Take 1 tablet (750 mg total) by mouth 2 (two) times daily., Disp: 60 tablet, Rfl: 1   insulin aspart (NOVOLOG FLEXPEN) 100 UNIT/ML FlexPen, Administer a sliding scale insulin dose based upon your CBG values 3 times a day with meals, according to the following scale: CBG 70-120 = 0 units CBG 121-150 = 1 unit CBG 151-200 = 2 units CBG 201-250 = 3 units CBG  251-300 = 5 units CBG 301-350 = 7 units CBG 351-400 = 9 units CBG > 400 = consult with your doctor (Patient not taking: Reported on 03/23/2023), Disp: 15 mL, Rfl: 11   insulin glargine (LANTUS) 100 UNIT/ML Solostar Pen, Inject 25 Units into the skin daily. (Patient not taking: Reported on 03/23/2023), Disp: 15 mL, Rfl: 11   Insulin Pen Needle 32G X 4 MM MISC, 1 Dose by Does not apply route in the morning, at noon, in the evening, and at bedtime., Disp: 100 each, Rfl: 11   metFORMIN (GLUCOPHAGE) 500 MG tablet, Take 1 tablet (500 mg total) by mouth 2 (two) times daily with a meal. (Patient not taking: Reported on 03/23/2023), Disp: 60 tablet, Rfl: 11   Social History: Social History   Tobacco Use   Smoking status: Never   Smokeless tobacco: Never  Substance Use Topics   Alcohol use: Yes    Alcohol/week: 0.0 standard drinks of alcohol    Comment: ocassionally   Drug use: No    Family Medical History: Family History  Problem Relation Age of Onset   Diabetes Mother     Physical Examination: Vitals:   03/23/23 0606 03/23/23 0630  BP: (!) 140/95 128/85  Pulse: 95 81  Resp: 20 18  Temp:    SpO2: 97% 97%     General: Patient is well developed, well nourished, calm, collected, and in no apparent distress.  Does have an abrasion on the left forehead  NEUROLOGICAL:  General:  In no acute distress.   Awake, alert, oriented to person, place, and time.  Pupils equal round and reactive to light.  Extraocular motions are intact without pain full Facial tone is symmetric.  Tongue protrusion is midline.  Bilateral upper extremities are full strength proximally and distally.  There is no pronator drift.  Language is conversant.  GCS:15   Bilateral upper and lower extremity sensation is intact to light touch.  Imaging: Narrative & Impression  CLINICAL DATA:  32 year old male with history of seizure.   EXAM: CT HEAD WITHOUT CONTRAST   TECHNIQUE: Contiguous axial images were obtained from  the base of the skull through the vertex without intravenous contrast.   RADIATION DOSE REDUCTION: This exam was performed according to the departmental dose-optimization program which includes automated exposure control, adjustment of the mA and/or kV according to patient size and/or use of iterative reconstruction technique.   COMPARISON:  Head CT 07/01/2022.   FINDINGS: Brain: No evidence of acute infarction, hemorrhage, hydrocephalus, extra-axial collection or mass lesion/mass effect.   Vascular: No hyperdense vessel or unexpected calcification.   Skull: Nondisplaced fracture of the left frontal bone along the left supraorbital region extending inferiorly and posteromedially along the floor of the anterior cranial fossa.   Sinuses/Orbits: No acute finding.   Other: Minimal soft tissue swelling in the left frontal scalp where there is some high attenuation measuring up to 5 mm in thickness, compatible with a small left frontal scalp hematoma.   IMPRESSION: 1. Small left frontal scalp hematoma, with nondisplaced fracture of the left frontal bone which extends from the left orbital ridge posteromedially along the floor of the left anterior cranial fossa. 2. No evidence of significant acute traumatic injury to the brain. No intra or extra-axial hemorrhage.     Electronically Signed   By: Trudie Reed M.D.   On: 03/23/2023 07:04       I have personally reviewed the images and agree with the above interpretation.  Labs:    Latest Ref Rng & Units 03/23/2023    6:13 AM 09/18/2022    2:41 PM 07/01/2022    1:24 AM  CBC  WBC 4.0 - 10.5 K/uL 6.6  5.8  10.7   Hemoglobin 13.0 - 17.0 g/dL 21.3  08.6  57.8   Hematocrit 39.0 - 52.0 % 44.9  44.3  46.7   Platelets 150 - 400 K/uL 101  163  175        Assessment and Plan: [name] is a pleasant 32 y.o. male with history of tonic-clonic seizures currently on prophylactic seizure medication.  Had a witnessed seizure fall  striking left side of his head.  This lasted for approximately 30 seconds.  He awoke and eventually returned to his baseline.  On neurologic exam he is intact with no evidence of focal deficit.  Extraocular motions are full.  No evidence of CSF leak.  Closed fracture of frontal bone, initial encounter (HCC) Witnessed seizure causing a fall striking his left frontal area.  Has a abrasion with a small subcutaneous hematoma.  CT scan demonstrated a linear skull fracture not going through any sinuses.  Clinically no evidence of CSF leak.  At this point does not meet any indications for surgical fixation.  Does not need further inpatient imaging.  He can follow-up in neurosurgery on an as-needed basis.  Seizure [facility]) Patient had a witnessed seizure approximately 30 seconds, caused him to lose consciousness fall striking his head.  After awakening return to his neurological  baseline was taken to the hospital.  At this point he is not having any residual Symptoms.  Should follow-up with his neurologist for his seizure control.  I have discussed the condition with the patient, including showing the radiographs and discussing treatment options in layman's terms.   Lovenia Kim, MD/MSCR Dept. of Neurosurgery

## 2023-03-23 NOTE — ED Provider Notes (Addendum)
[facility] Provider Note    Event Date/Time   First MD Initiated Contact with Patient 03/23/23 [phone]     (approximate)   History   Seizures   HPI  Benjamin Richardson is a 32 y.o. male who presents to the ED for evaluation of Seizures   Review clinic visit from February and May.  History of HTN, DM and seizure disorder.  Patient presents after reported seizure episode that occurred at his workplace.  On arrival to the ED, patient is uncertain what happened.  He members going to work and then "I do not know what happened."  Denies any pain or concerns right now, feels embarrassed coming to the ER.  Reports compliance with his medications recently without recent illnesses   Physical Exam   Triage Vital Signs: ED Triage Vitals  Encounter Vitals Group     BP --      Systolic BP Percentile --      Diastolic BP Percentile --      Pulse Rate 03/23/23 0603 90     Resp 03/23/23 0603 18     Temp 03/23/23 0603 98.7 F (37.1 C)     Temp Source 03/23/23 0603 Oral     SpO2 03/23/23 0559 98 %     Weight --      Height --      Head Circumference --      Peak Flow --      Pain Score 03/23/23 0606 0     Pain Loc --      Pain Education --      Exclude from Growth Chart --     Most recent vital signs: Vitals:   03/23/23 0603 03/23/23 0606  BP:  (!) 140/95  Pulse: 90 95  Resp: 18 20  Temp: 98.7 F (37.1 C)   SpO2: 98% 97%    General: Awake, no distress.  CV:  Good peripheral perfusion.  Resp:  Normal effort.  Abd:  No distention.  MSK:  No deformity noted.  Neuro:  No focal deficits appreciated. Cranial nerves II through XII intact 5/5 strength and sensation in all 4 extremities Other:  Small superficial abrasion to the left side of the forehead without signs of EOM entrapment or skull fracture.   ED Results / Procedures / Treatments   Labs (all labs ordered are listed, but only abnormal results are displayed) Labs Reviewed  CBC WITH  DIFFERENTIAL/PLATELET - Abnormal; Notable for the following components:      Result Value   Platelets 101 (*)    All other components within normal limits  URINALYSIS, ROUTINE W REFLEX MICROSCOPIC  BASIC METABOLIC PANEL    EKG Sinus rhythm with a rate of 96 bpm.  Normal axis and intervals.  No clear signs of acute ischemia.  RADIOLOGY CT head interpreted by me without evidence of acute intracranial pathology, nondisplaced frontal bone fracture  Official radiology report(s): CT HEAD WO CONTRAST ( )  Result Date: 03/23/2023 CLINICAL DATA:  32 year old male with history of seizure. EXAM: CT HEAD WITHOUT CONTRAST TECHNIQUE: Contiguous axial images were obtained from the base of the skull through the vertex without intravenous contrast. RADIATION DOSE REDUCTION: This exam was performed according to the departmental dose-optimization program which includes automated exposure control, adjustment of the mA and/or kV according to patient size and/or use of iterative reconstruction technique. COMPARISON:  Head CT 07/01/2022. FINDINGS: Brain: No evidence of acute infarction, hemorrhage, hydrocephalus, extra-axial collection or mass lesion/mass effect. Vascular:  No hyperdense vessel or unexpected calcification. Skull: Nondisplaced fracture of the left frontal bone along the left supraorbital region extending inferiorly and posteromedially along the floor of the anterior cranial fossa. Sinuses/Orbits: No acute finding. Other: Minimal soft tissue swelling in the left frontal scalp where there is some high attenuation measuring up to 5 mm in thickness, compatible with a small left frontal scalp hematoma. IMPRESSION: 1. Small left frontal scalp hematoma, with nondisplaced fracture of the left frontal bone which extends from the left orbital ridge posteromedially along the floor of the left anterior cranial fossa. 2. No evidence of significant acute traumatic injury to the brain. No intra or extra-axial hemorrhage.  Electronically Signed   By: Trudie Reed M.D.   On: 03/23/2023 07:04    PROCEDURES and INTERVENTIONS:  .1-3 Lead EKG Interpretation  Performed by: Delton Prairie, MD Authorized by: Delton Prairie, MD     Interpretation: normal     ECG rate:  90   ECG rate assessment: normal     Rhythm: sinus rhythm     Ectopy: none     Conduction: normal   .Critical Care  Performed by: Delton Prairie, MD Authorized by: Delton Prairie, MD   Critical care provider statement:    Critical care time (minutes):  30   Critical care was necessary to treat or prevent imminent or life-threatening deterioration of the following conditions:  Trauma   Critical care was time spent personally by me on the following activities:  Development of treatment plan with patient or surrogate, discussions with consultants, evaluation of patient's response to treatment, examination of patient, ordering and review of laboratory studies, ordering and review of radiographic studies, ordering and performing treatments and interventions, pulse oximetry, re-evaluation of patient's condition and review of old charts   Medications  levETIRAcetam (KEPPRA) IVPB 1500 mg/ 100 mL premix (1,500 mg Intravenous New Bag/Given 03/23/23 0620)     IMPRESSION / MDM / ASSESSMENT AND PLAN / ED COURSE  I reviewed the triage vital signs and the nursing notes.  Differential diagnosis includes, but is not limited to, DKA, medication noncompliance, ICH or skull fracture  {Patient presents with symptoms of an acute illness or injury that is potentially life-threatening.  Patient presents after breakthrough seizure from work.  Return to baseline by the time he arrived to the ED without evidence of neurologic deficits.  Has a small abrasion on his forehead but no further signs of acute trauma.   CT does have a nondisplaced frontal bone fracture and I educate the patient of this as well with his father at the bedside.  We will consult neuro 3.  Awaiting  metabolic panel to assess for unlikely DKA considering his history of this.  Signed out to oncoming provider.  Clinical Course as of 03/23/23 4098  Tue Mar 23, 2023  0707 Patient signed out to oncoming provider pending remainder of workup [DS]    Clinical Course User Index [DS] Delton Prairie, MD     FINAL CLINICAL IMPRESSION(S) / ED DIAGNOSES   Final diagnoses:  Seizure (HCC)  Closed fracture of frontal bone, initial encounter [facility]     Rx / DC Orders   ED Discharge Orders     None        Note:  This document was prepared using Dragon voice recognition software and may include unintentional dictation errors.   Delton Prairie, MD 03/23/23 1191    Delton Prairie, MD 03/23/23 [phone]

## 2023-03-23 NOTE — Discharge Instructions (Signed)
You were seen in the emergency department following a seizure.  You had a CT scan of your head that showed a skull fracture.  You were evaluated by neurosurgery and you do not need any further follow-up, it will heal on its own.  You do need close follow-up with your neurologist for further management for your seizures.  Do not drive or operate any heavy machinery that may injure yourself or others for the next 6 months or until you are cleared and followed up with neurology.  Your glucose was significantly elevated in the emergency department.  You are given IV fluids and your glucose improved.  Check your glucose frequently today and make sure you correct as needed.  Follow-up with your primary care physician for further management of your diabetes.

## 2023-03-23 NOTE — ED Triage Notes (Addendum)
Pt comes from work by EMS for a witnessed seizure. The witnessed seizure lasted about 30 sec., described as a tonic clonic seizure. Pt has a known hx of seizures and is on Keppra. Pt denies missing a dose and does not remember the event. A&Ox4. Pt did fall and hit his head and caused a hematoma in the left temporal area. Last seizures was about a yr ago.

## 2023-05-04 ENCOUNTER — Emergency Department: Payer: Managed Care, Other (non HMO)

## 2023-05-04 ENCOUNTER — Other Ambulatory Visit: Payer: Self-pay

## 2023-05-04 ENCOUNTER — Emergency Department
Admission: EM | Admit: 2023-05-04 | Discharge: 2023-05-04 | Disposition: A | Discharge status: Home / Self Care | Payer: Managed Care, Other (non HMO) | Attending: Emergency Medicine | Admitting: Emergency Medicine

## 2023-05-04 DIAGNOSIS — R739 Hyperglycemia, unspecified: Secondary | ICD-10-CM

## 2023-05-04 DIAGNOSIS — Z794 Long term (current) use of insulin: Secondary | ICD-10-CM | POA: Insufficient documentation

## 2023-05-04 DIAGNOSIS — Z7984 Long term (current) use of oral hypoglycemic drugs: Secondary | ICD-10-CM | POA: Diagnosis not present

## 2023-05-04 DIAGNOSIS — E1165 Type 2 diabetes mellitus with hyperglycemia: Secondary | ICD-10-CM | POA: Insufficient documentation

## 2023-05-04 DIAGNOSIS — R55 Syncope and collapse: Secondary | ICD-10-CM | POA: Diagnosis present

## 2023-05-04 DIAGNOSIS — I10 Essential (primary) hypertension: Secondary | ICD-10-CM | POA: Diagnosis not present

## 2023-05-04 LAB — COMPREHENSIVE METABOLIC PANEL
ALT: 31 U/L (ref 0–44)
AST: 28 U/L (ref 15–41)
Albumin: 4.3 g/dL (ref 3.5–5.0)
Alkaline Phosphatase: 59 U/L (ref 38–126)
Anion gap: 11 (ref 5–15)
BUN: 14 mg/dL (ref 6–20)
CO2: 23 mmol/L (ref 22–32)
Calcium: 9.3 mg/dL (ref 8.9–10.3)
Chloride: 101 mmol/L (ref 98–111)
Creatinine, Ser: 1.07 mg/dL (ref 0.61–1.24)
GFR, Estimated: >60 mL/min (ref >60)
Glucose, Bld: 543 mg/dL (ref 70–99)
Potassium: 4.2 mmol/L (ref 3.5–5.1)
Sodium: 135 mmol/L (ref 135–145)
Total Bilirubin: 0.6 mg/dL (ref 0.3–1.2)
Total Protein: 7.3 g/dL (ref 6.5–8.1)

## 2023-05-04 LAB — CBC
HCT: 46.2 % (ref 39.0–52.0)
Hemoglobin: 15.2 g/dL (ref 13.0–17.0)
MCH: 26.9 pg (ref 26.0–34.0)
MCHC: 32.9 g/dL (ref 30.0–36.0)
MCV: 81.6 fL (ref 80.0–100.0)
Platelets: 165 10*3/uL (ref 150–400)
RBC: 5.66 MIL/uL (ref 4.22–5.81)
RDW: 12.4 % (ref 11.5–15.5)
WBC: 6.2 10*3/uL (ref 4.0–10.5)
nRBC: 0 % (ref 0.0–0.2)

## 2023-05-04 LAB — CBG MONITORING, ED: Glucose-Capillary: 369 mg/dL — ABNORMAL HIGH (ref 70–99)

## 2023-05-04 LAB — TROPONIN I (HIGH SENSITIVITY): Troponin I (High Sensitivity): 5 ng/L (ref <18)

## 2023-05-04 MED ORDER — SODIUM CHLORIDE 0.9 % IV BOLUS
500.0000 mL | Freq: Once | INTRAVENOUS | Status: AC
  Administered 2023-05-04: 500 mL via INTRAVENOUS

## 2023-05-04 MED ORDER — SODIUM CHLORIDE 0.9 % IV BOLUS
1000.0000 mL | Freq: Once | INTRAVENOUS | Status: AC
  Administered 2023-05-04: 1000 mL via INTRAVENOUS

## 2023-05-04 MED ORDER — SODIUM CHLORIDE 0.9 % IV SOLN
750.0000 mg | Freq: Once | INTRAVENOUS | Status: AC
  Administered 2023-05-04: 750 mg via INTRAVENOUS
  Filled 2023-05-04: qty 7.5

## 2023-05-04 NOTE — ED Provider Notes (Signed)
-----------------------------------------   7:08 AM on 05/04/2023 -----------------------------------------  Blood pressure (!) 130/90, pulse (!) 105, temperature 98.5 F (36.9 C), temperature source Oral, resp. rate 20, height 6\' 2"  (1.88 m), weight 94.3 kg, SpO2 97%.  Assuming care from Dr. Dolores Frame.  In short, Benjamin Richardson is a 32 y.o. male with a chief complaint of Seizures .  Refer to the original H&P for additional details.  The current plan of care is to follow-up repeat FSBG following IVF.  ----------------------------------------- 8:01 AM on 05/04/2023 ----------------------------------------- Repeat blood glucose is improved, patient with no further episodes here in the ED and denies any complaints on reassessment.  He does state that his seizures have been increasing in frequency and he was counseled to follow-up with neurology for reevaluation.  He was counseled to return to the ED for new or worsening symptoms, patient agrees with plan.    Chesley Noon, MD 05/04/23 [phone]

## 2023-05-04 NOTE — Discharge Instructions (Addendum)
Take your seizure medication daily.  Take your diabetes medicines as directed by your doctor.  Do not drive or operate heavy machinery until seen by your neurologist.  Return to the ER for recurrent or worsening symptoms, persistent vomiting, difficulty breathing or other concerns.

## 2023-05-04 NOTE — ED Triage Notes (Signed)
Pt to ED via EMS from work, pt reports missing dose of keppra today, at work had possible seizure. Pt states he fell backwards and was confused for a few moments. Pt a&ox4. Denies pain.

## 2023-05-04 NOTE — ED Provider Notes (Signed)
[facility] Provider Note    Event Date/Time   First MD Initiated Contact with Patient 05/04/23 [id number]     (approximate)   History   Seizures   HPI  Benjamin Richardson is a 32 y.o. male brought to the ED via EMS from work at [employer] with a chief complaint of syncope.  Patient with a history of diabetes, seizure disorder on Keppra 750 mg twice daily.  States he missed his dose yesterday.  Patient was at work and his coworkers states he just fell backwards.  He was confused for few minutes afterwards.  Did not bite tongue or suffer urinary incontinence.  Patient states he feels fine and does not wish to be in the emergency department.  Denies recent fever/chills, cough, chest pain, shortness of breath, abdominal pain, nausea, vomiting or dizziness.  Denies headache or vision changes.     Past Medical History   Past Medical History:  Diagnosis Date   DM hyperosmolarity type II, uncontrolled (HCC)    Elevated blood pressure reading without diagnosis of hypertension    Isolated hypertriglyceridemia    Obesity, Class I, BMI 30-34.9    Seizure [facility])      Active Problem List   Patient Active Problem List   Diagnosis Date Noted   Skull fracture, linear, closed, initial encounter (HCC) 03/23/2023   Closed fracture of frontal bone (HCC) 03/23/2023   Seizure (HCC) 03/23/2023   DM hyperosmolarity type II, uncontrolled (HCC) 10/06/2022   Essential hypertension 10/08/2020   Seizure disorder (HCC) 07/25/2015   Blood pressure elevated without history of HTN 03/28/2015   Hypertriglyceridemia 03/28/2015   Obesity, Class II, BMI 35-39.9 03/28/2015   Pre-diabetes 03/28/2015     Past Surgical History  History reviewed. No pertinent surgical history.   Home Medications   Prior to Admission medications   Medication Sig Start Date End Date Taking? Authorizing Provider  Calcium Carb-Cholecalciferol 500-10 MG-MCG TABS Take 1 tablet by mouth every 7 (seven) days.     [provider]  insulin aspart (NOVOLOG FLEXPEN) 100 UNIT/ML FlexPen Administer a sliding scale insulin dose based upon your CBG values 3 times a day with meals, according to the following scale: CBG 70-120 = 0 units CBG 121-150 = 1 unit CBG 151-200 = 2 units CBG 201-250 = 3 units CBG 251-300 = 5 units CBG 301-350 = 7 units CBG 351-400 = 9 units CBG > 400 = consult with your doctor Patient not taking: Reported on 03/23/2023 10/10/20   Lonia Blood, MD  insulin glargine (LANTUS) 100 UNIT/ML Solostar Pen Inject 25 Units into the skin daily. Patient not taking: Reported on 03/23/2023 10/10/20   Lonia Blood, MD  Insulin Pen Needle 32G X 4 MM MISC 1 Dose by Does not apply route in the morning, at noon, in the evening, and at bedtime. 10/10/20   Lonia Blood, MD  levETIRAcetam (KEPPRA) 750 MG tablet Take 1 tablet (750 mg total) by mouth 2 (two) times daily. 07/01/22   Ward, Layla Maw, DO  metFORMIN (GLUCOPHAGE) 500 MG tablet Take 1 tablet (500 mg total) by mouth 2 (two) times daily with a meal. Patient not taking: Reported on 03/23/2023 10/10/20   Lonia Blood, MD     Allergies  Patient has no known allergies.   Family History   Family History  Problem Relation Age of Onset   Diabetes Mother      Physical Exam  Triage Vital Signs: ED Triage Vitals  Encounter Vitals Group     BP --      Systolic BP Percentile --      Diastolic BP Percentile --      Pulse --      Resp --      Temp --      Temp src --      SpO2 --      Weight 05/04/23 0532 208 lb (94.3 kg)     Height 05/04/23 0532 6\' 2"  (1.88 m)     Head Circumference --      Peak Flow --      Pain Score 05/04/23 0531 0     Pain Loc --      Pain Education --      Exclude from Growth Chart --     Updated Vital Signs: BP (!) 130/90   Pulse (!) 105   Temp 98.5 F (36.9 C) (Oral)   Resp 20   Ht 6\' 2"  (1.88 m)   Wt 94.3 kg   SpO2 97%   BMI 26.71 kg/m    General: Awake, no distress.   CV:  Mild tachycardia.  Good peripheral perfusion.  Resp:  Normal effort.  CTAB. Abd:  Nontender.  No distention.  Other:  PERRL.  EOMI.  Did not bite tongue.  No carotid bruits.  Supple neck without meningismus.  No midline cervical spine tenderness to palpation, step-offs or deformities noted.  Alert and oriented x 3.  CN II-XII intact.  5/5 motor strength and sensation all extremities.  MAE x 4.   ED Results / Procedures / Treatments  Labs (all labs ordered are listed, but only abnormal results are displayed) Labs Reviewed  COMPREHENSIVE METABOLIC PANEL - Abnormal; Notable for the following components:      Result Value   Glucose, Bld 543 (*)    All other components within normal limits  CBC  TROPONIN I (HIGH SENSITIVITY)     EKG  ED ECG REPORT I, Christpoher Sievers J, the attending physician, personally viewed and interpreted this ECG.   Date: 05/04/2023  EKG Time: 0533  Rate: 106  Rhythm: sinus tachycardia  Axis: Normal  Intervals:none  ST&T Change: Nonspecific    RADIOLOGY I have independently visualized and interpreted patient's imaging study as well as noted the radiology interpretation:  CT head: No ICH  Official radiology report(s): CT Head Wo Contrast  Result Date: 05/04/2023 CLINICAL DATA:  32 year old male with history of altered mental status. EXAM: CT HEAD WITHOUT CONTRAST TECHNIQUE: Contiguous axial images were obtained from the base of the skull through the vertex without intravenous contrast. RADIATION DOSE REDUCTION: This exam was performed according to the departmental dose-optimization program which includes automated exposure control, adjustment of the mA and/or kV according to patient size and/or use of iterative reconstruction technique. COMPARISON:  Head CT 03/23/2023. FINDINGS: Brain: No evidence of acute infarction, hemorrhage, hydrocephalus, extra-axial collection or mass lesion/mass effect. Vascular: No hyperdense vessel or unexpected calcification.  Skull: Nondisplaced fracture of the left frontal bone slightly less apparent than the recent prior CT examination from 03/23/2023, presumably related to bony healing. No new acute displaced skull fracture is otherwise noted. Sinuses/Orbits: No acute finding. Other: None. IMPRESSION: 1. No acute intracranial abnormalities. 2. Healing nondisplaced fracture of the left frontal bone originally identified on prior head CT 03/23/2023. Electronically Signed   By: Trudie Reed M.D.   On: 05/04/2023 06:06     PROCEDURES:  Critical Care performed: No  .1-3 Lead EKG Interpretation  Performed by: Irean Hong, MD Authorized by: Irean Hong, MD     Interpretation: abnormal     ECG rate:  105   ECG rate assessment: tachycardic     Rhythm: sinus tachycardia     Ectopy: none     Conduction: normal   Comments:     Patient placed on cardiac monitor to evaluate for arrhythmias    MEDICATIONS ORDERED IN ED: Medications  sodium chloride 0.9 % bolus 1,000 mL (has no administration in time range)  sodium chloride 0.9 % bolus 500 mL (0 mLs Intravenous Stopped 05/04/23 0651)  levETIRAcetam (KEPPRA) 750 mg in sodium chloride 0.9 % 100 mL IVPB (0 mg Intravenous Stopped 05/04/23 0650)     IMPRESSION / MDM / ASSESSMENT AND PLAN / ED COURSE  I reviewed the triage vital signs and the nursing notes.                             32 year old male brought for syncope, history of seizure disorder.  Differential diagnosis includes but is not limited to ICH, CVA, ACS, metabolic, infectious etiologies, etc.  I have personally reviewed patient's records and note he was seen in the ED for seizure on 03/23/2023.  Patient had a PCP office visit on 10/06/2022 for follow-up poorly controlled diabetes and seizure disorder.  Patient's presentation is most consistent with acute presentation with potential threat to life or bodily function.  The patient is on the cardiac monitor to evaluate for evidence of arrhythmia and/or  significant heart rate changes.  Will obtain lab work, CT head.  Administer missed dose of Keppra via IV.  Will continue to monitor and reassess.  Clinical Course as of 05/04/23 0653  Tue May 04, 2023  0651 Patient awake, alert, chatting with coworkers who are at bedside.  Glucose over 500 without elevation of anion gap, will give additional IV fluids.  Patient's records indicate he is a poorly controlled diabetic.  Anticipate discharge home upon completion of IV fluids and recheck of glucose.  Care will be transferred to the oncoming provider at change of shift. [JS]    Clinical Course User Index [JS] Irean Hong, MD     FINAL CLINICAL IMPRESSION(S) / ED DIAGNOSES   Final diagnoses:  Syncope, unspecified syncope type  Hyperglycemia     Rx / DC Orders   ED Discharge Orders     None        Note:  This document was prepared using Dragon voice recognition software and may include unintentional dictation errors.   Irean Hong, MD 05/04/23 [phone]

## 2023-05-10 ENCOUNTER — Other Ambulatory Visit: Payer: Managed Care, Other (non HMO)

## 2023-05-10 DIAGNOSIS — E1100 Type 2 diabetes mellitus with hyperosmolarity without nonketotic hyperglycemic-hyperosmolar coma (NKHHC): Secondary | ICD-10-CM

## 2023-05-10 DIAGNOSIS — I10 Essential (primary) hypertension: Secondary | ICD-10-CM

## 2023-05-10 DIAGNOSIS — E781 Pure hyperglyceridemia: Secondary | ICD-10-CM

## 2023-05-11 LAB — LIPID PANEL
Chol/HDL Ratio: 3.1 ratio (ref 0.0–5.0)
Cholesterol, Total: 185 mg/dL (ref 100–199)
HDL: 59 mg/dL (ref >39)
LDL Chol Calc (NIH): 112 mg/dL — ABNORMAL HIGH (ref 0–99)
Triglycerides: 77 mg/dL (ref 0–149)
VLDL Cholesterol Cal: 14 mg/dL (ref 5–40)

## 2023-05-11 LAB — CMP14+EGFR
ALT: 30 IU/L (ref 0–44)
AST: 19 IU/L (ref 0–40)
Albumin: 4.1 g/dL (ref 4.1–5.1)
Alkaline Phosphatase: 61 IU/L (ref 44–121)
BUN/Creatinine Ratio: 11 (ref 9–20)
BUN: 10 mg/dL (ref 6–20)
Bilirubin Total: 0.4 mg/dL (ref 0.0–1.2)
CO2: 24 mmol/L (ref 20–29)
Calcium: 9.7 mg/dL (ref 8.7–10.2)
Chloride: 103 mmol/L (ref 96–106)
Creatinine, Ser: 0.94 mg/dL (ref 0.76–1.27)
Globulin, Total: 2.1 g/dL (ref 1.5–4.5)
Glucose: 237 mg/dL — ABNORMAL HIGH (ref 70–99)
Potassium: 4.7 mmol/L (ref 3.5–5.2)
Sodium: 142 mmol/L (ref 134–144)
Total Protein: 6.2 g/dL (ref 6.0–8.5)
eGFR: 110 mL/min/{1.73_m2} (ref >59)

## 2023-05-11 LAB — HEMOGLOBIN A1C
Est. average glucose Bld gHb Est-mCnc: >398 mg/dL
Hgb A1c MFr Bld: >15.5 % — ABNORMAL HIGH (ref 4.8–5.6)

## 2023-05-11 LAB — TSH: TSH: 0.646 u[IU]/mL (ref 0.450–4.500)

## 2023-05-14 ENCOUNTER — Encounter: Payer: Self-pay | Admitting: Cardiology

## 2023-05-14 ENCOUNTER — Ambulatory Visit (INDEPENDENT_AMBULATORY_CARE_PROVIDER_SITE_OTHER): Payer: Managed Care, Other (non HMO) | Admitting: Cardiology

## 2023-05-14 VITALS — BP 140/90 | HR 79 | Ht 74.0 in | Wt 226.8 lb

## 2023-05-14 DIAGNOSIS — E1100 Type 2 diabetes mellitus with hyperosmolarity without nonketotic hyperglycemic-hyperosmolar coma (NKHHC): Secondary | ICD-10-CM | POA: Diagnosis not present

## 2023-05-14 DIAGNOSIS — R7303 Prediabetes: Secondary | ICD-10-CM

## 2023-05-14 DIAGNOSIS — I10 Essential (primary) hypertension: Secondary | ICD-10-CM | POA: Diagnosis not present

## 2023-05-14 LAB — POCT CBG (FASTING - GLUCOSE)-MANUAL ENTRY: Glucose Fasting, POC: 122 mg/dL — AB (ref 70–99)

## 2023-05-14 MED ORDER — LOSARTAN POTASSIUM 25 MG PO TABS: 25.0000 mg | ORAL_TABLET | Freq: Every day | ORAL | 3 refills | Status: AC

## 2023-05-14 MED ORDER — VITAMIN D (ERGOCALCIFEROL) 1.25 MG (50000 UNIT) PO CAPS: 50000.0000 [IU] | ORAL_CAPSULE | ORAL | 3 refills | Status: AC

## 2023-05-14 NOTE — Progress Notes (Signed)
Established Patient Office Visit  Subjective:  Patient ID: Benjamin Richardson, male    DOB: Apr 30, 1991  Age: 32 y.o. MRN: [id number]  Chief Complaint  Patient presents with   Follow-up    4 mo with lab results    Patient in office for 4 month follow up, discuss recent lab results. Patient reports feeling well, no complaints. Patient in the ED twice in the past two months for seizures. Patient followed by neurology. Patient admits to be non-compliant with his medications. Patient states he restarted his metformin and insulin last week. Patient unsure if he is taking his rosuvastatin and losartan, both refilled today. Discussed importance of taking medications as prescribed to prevent complications from DM.  Referral sent to ophthalmology for DEE.     No other concerns at this time.   Past Medical History:  Diagnosis Date   DM hyperosmolarity type II, uncontrolled (HCC)    Elevated blood pressure reading without diagnosis of hypertension    Isolated hypertriglyceridemia    Obesity, Class I, BMI 30-34.9    Seizure (HCC)     History reviewed. No pertinent surgical history.  Social History   Socioeconomic History   Marital status: Single    Spouse name: Not on file   Number of children: Not on file   Years of education: Not on file   Highest education level: Not on file  Occupational History   Not on file  Tobacco Use   Smoking status: Never   Smokeless tobacco: Never  Substance and Sexual Activity   Alcohol use: Yes    Alcohol/week: 0.0 standard drinks of alcohol    Comment: ocassionally   Drug use: No   Sexual activity: Never  Other Topics Concern   Not on file  Social History Narrative   Not on file   Social Determinants of Health   Financial Resource Strain: Not on file  Food Insecurity: Not on file  Transportation Needs: Not on file  Physical Activity: Not on file  Stress: Not on file  Social Connections: Not on file  Intimate Partner Violence: Not on  file    Family History  Problem Relation Age of Onset   Diabetes Mother     No Known Allergies  Review of Systems  Constitutional: Negative.   HENT: Negative.    Eyes: Negative.   Respiratory: Negative.  Negative for shortness of breath.   Cardiovascular: Negative.  Negative for chest pain.  Gastrointestinal: Negative.  Negative for abdominal pain, constipation and diarrhea.  Genitourinary: Negative.   Musculoskeletal:  Negative for joint pain and myalgias.  Skin: Negative.   Neurological: Negative.  Negative for dizziness and headaches.  Endo/Heme/Allergies: Negative.   All other systems reviewed and are negative.      Objective:   BP (!) 140/90   Pulse 79   Ht 6\' 2"  (1.88 m)   Wt 226 lb 12.8 oz (102.9 kg)   SpO2 97%   BMI 29.12 kg/m   Vitals:   05/14/23 0911  BP: (!) 140/90  Pulse: 79  Height: 6\' 2"  (1.88 m)  Weight: 226 lb 12.8 oz (102.9 kg)  SpO2: 97%  BMI (Calculated): 29.11    Physical Exam Nursing note reviewed.  Constitutional:      Appearance: Normal appearance. He is normal weight.  HENT:     Head: Normocephalic and atraumatic.     Nose: Nose normal.     Mouth/Throat:     Mouth: Mucous membranes are moist.  Pharynx: Oropharynx is clear.  Eyes:     Extraocular Movements: Extraocular movements intact.     Conjunctiva/sclera: Conjunctivae normal.     Pupils: Pupils are equal, round, and reactive to light.  Cardiovascular:     Rate and Rhythm: Normal rate and regular rhythm.     Pulses: Normal pulses.     Heart sounds: Normal heart sounds.  Pulmonary:     Effort: Pulmonary effort is normal.     Breath sounds: Normal breath sounds.  Abdominal:     General: Abdomen is flat. Bowel sounds are normal.     Palpations: Abdomen is soft.  Musculoskeletal:        General: Normal range of motion.     Cervical back: Normal range of motion.  Skin:    General: Skin is warm and dry.  Neurological:     General: No focal deficit present.      Mental Status: He is alert and oriented to person, place, and time.  Psychiatric:        Mood and Affect: Mood normal.        Behavior: Behavior normal.        Thought Content: Thought content normal.        Judgment: Judgment normal.      Results for orders placed or performed in visit on 05/14/23  POCT CBG (Fasting - Glucose)  Result Value Ref Range   Glucose Fasting, POC 122 (A) 70 - 99 mg/dL    Recent Results (from the past 2160 hour(s))  CBC with Differential/Platelet     Status: Abnormal   Collection Time: 03/23/23  6:13 AM  Result Value Ref Range   WBC 6.6 4.0 - 10.5 K/uL   RBC 5.25 4.22 - 5.81 MIL/uL   Hemoglobin 14.4 13.0 - 17.0 g/dL   HCT 96.0 45.4 - 09.8 %   MCV 85.5 80.0 - 100.0 fL   MCH 27.4 26.0 - 34.0 pg   MCHC 32.1 30.0 - 36.0 g/dL   RDW 11.9 14.7 - 82.9 %   Platelets 101 (L) 150 - 400 K/uL    Comment: Immature Platelet Fraction may be clinically indicated, consider ordering this additional test [id number]    nRBC 0.0 0.0 - 0.2 %   Neutrophils Relative % 53 %   Neutro Abs 3.6 1.7 - 7.7 K/uL   Lymphocytes Relative 38 %   Lymphs Abs 2.5 0.7 - 4.0 K/uL   Monocytes Relative 7 %   Monocytes Absolute 0.5 0.1 - 1.0 K/uL   Eosinophils Relative 1 %   Eosinophils Absolute 0.0 0.0 - 0.5 K/uL   Basophils Relative 0 %   Basophils Absolute 0.0 0.0 - 0.1 K/uL   Immature Granulocytes 1 %   Abs Immature Granulocytes 0.03 0.00 - 0.07 K/uL    Comment: Performed at [facility], [address] [place], [place] [address]  Urinalysis, Routine w reflex microscopic -Urine, Clean Catch     Status: Abnormal   Collection Time: 03/23/23  6:13 AM  Result Value Ref Range   Color, Urine STRAW (A) YELLOW   APPearance CLEAR (A) CLEAR   Specific Gravity, Urine 1.030 1.005 - 1.030   pH 5.0 5.0 - 8.0   Glucose, UA >=500 (A) NEGATIVE mg/dL   Hgb urine dipstick NEGATIVE NEGATIVE   Bilirubin Urine NEGATIVE NEGATIVE   Ketones, ur NEGATIVE NEGATIVE mg/dL   Protein, ur  NEGATIVE NEGATIVE mg/dL   Nitrite NEGATIVE NEGATIVE   Leukocytes,Ua NEGATIVE NEGATIVE   RBC /  HPF 0-5 0 - 5 RBC/hpf   WBC, UA NONE SEEN 0 - 5 WBC/hpf   Bacteria, UA NONE SEEN NONE SEEN   Squamous Epithelial / HPF 0-5 0 - 5 /HPF    Comment: Performed at [facility], [address] [place], [place] [address]  Basic metabolic panel     Status: Abnormal   Collection Time: 03/23/23  7:36 AM  Result Value Ref Range   Sodium 135 135 - 145 mmol/L   Potassium 3.8 3.5 - 5.1 mmol/L   Chloride 104 98 - 111 mmol/L   CO2 23 22 - 32 mmol/L   Glucose, Bld 401 (H) 70 - 99 mg/dL    Comment: Glucose reference range applies only to samples taken after fasting for at least 8 hours.   BUN 10 6 - 20 mg/dL   Creatinine, Ser 1.91 0.61 - 1.24 mg/dL   Calcium 8.3 (L) 8.9 - 10.3 mg/dL   GFR, Estimated >47 >82 mL/min    Comment: (NOTE) Calculated using the CKD-EPI Creatinine Equation (2021)    Anion gap 8 5 - 15    Comment: Performed at [facility], [address] [place], [place] [address]  CBG monitoring, ED     Status: Abnormal   Collection Time: 03/23/23 10:12 AM  Result Value Ref Range   Glucose-Capillary 218 (H) 70 - 99 mg/dL    Comment: Glucose reference range applies only to samples taken after fasting for at least 8 hours.  CBC     Status: None   Collection Time: 05/04/23  5:34 AM  Result Value Ref Range   WBC 6.2 4.0 - 10.5 K/uL   RBC 5.66 4.22 - 5.81 MIL/uL   Hemoglobin 15.2 13.0 - 17.0 g/dL   HCT 30.8 65.7 - 84.6 %   MCV 81.6 80.0 - 100.0 fL   MCH 26.9 26.0 - 34.0 pg   MCHC 32.9 30.0 - 36.0 g/dL   RDW 96.2 95.2 - 84.1 %   Platelets 165 150 - 400 K/uL   nRBC 0.0 0.0 - 0.2 %    Comment: Performed at [facility], [address], [place], [place] [address]  Comprehensive metabolic panel     Status: Abnormal   Collection Time: 05/04/23  5:34 AM  Result Value Ref Range   Sodium 135 135 - 145 mmol/L   Potassium 4.2 3.5 - 5.1 mmol/L   Chloride 101 98 - 111  mmol/L   CO2 23 22 - 32 mmol/L   Glucose, Bld 543 (HH) 70 - 99 mg/dL    Comment: CRITICAL RESULT CALLED TO, READ BACK BY AND VERIFIED WITH Hunter orre @0650  on 05/04/23 by hkp Glucose reference range applies only to samples taken after fasting for at least 8 hours.    BUN 14 6 - 20 mg/dL   Creatinine, Ser 1.02 0.61 - 1.24 mg/dL   Calcium 9.3 8.9 - 72.5 mg/dL   Total Protein 7.3 6.5 - 8.1 g/dL   Albumin 4.3 3.5 - 5.0 g/dL   AST 28 15 - 41 U/L   ALT 31 0 - 44 U/L   Alkaline Phosphatase 59 38 - 126 U/L   Total Bilirubin 0.6 0.3 - 1.2 mg/dL   GFR, Estimated >36 >64 mL/min    Comment: (NOTE) Calculated using the CKD-EPI Creatinine Equation (2021)    Anion gap 11 5 - 15    Comment: Performed at [facility], [address] [place], [place] [address]  Troponin I (High Sensitivity)  Status: None   Collection Time: 05/04/23  5:34 AM  Result Value Ref Range   Troponin I (High Sensitivity) 5 <18 ng/L    Comment: (NOTE) Elevated high sensitivity troponin I (hsTnI) values and significant  changes across serial measurements may suggest ACS but many other  chronic and acute conditions are known to elevate hsTnI results.  Refer to the "Links" section for chest pain algorithms and additional  guidance. Performed at [facility], [address] [place], [place] [address]   CBG monitoring, ED     Status: Abnormal   Collection Time: 05/04/23  7:35 AM  Result Value Ref Range   Glucose-Capillary 369 (H) 70 - 99 mg/dL    Comment: Glucose reference range applies only to samples taken after fasting for at least 8 hours.  Hemoglobin A1c     Status: Abnormal   Collection Time: 05/10/23 11:14 AM  Result Value Ref Range   Hgb A1c MFr Bld >15.5 (H) 4.8 - 5.6 %    Comment:          Prediabetes: 5.7 - 6.4          Diabetes: >6.4          Glycemic control for adults with diabetes: <7.0    Est. average glucose Bld gHb Est-mCnc >398 mg/dL  TSH     Status: None   Collection  Time: 05/10/23 11:14 AM  Result Value Ref Range   TSH 0.646 0.450 - 4.500 uIU/mL  CMP14+EGFR     Status: Abnormal   Collection Time: 05/10/23 11:14 AM  Result Value Ref Range   Glucose 237 (H) 70 - 99 mg/dL   BUN 10 6 - 20 mg/dL   Creatinine, Ser 6.04 0.76 - 1.27 mg/dL   eGFR 540 >98 JX/BJY/7.82   BUN/Creatinine Ratio 11 9 - 20   Sodium 142 134 - 144 mmol/L   Potassium 4.7 3.5 - 5.2 mmol/L   Chloride 103 96 - 106 mmol/L   CO2 24 20 - 29 mmol/L   Calcium 9.7 8.7 - 10.2 mg/dL   Total Protein 6.2 6.0 - 8.5 g/dL   Albumin 4.1 4.1 - 5.1 g/dL   Globulin, Total 2.1 1.5 - 4.5 g/dL   Bilirubin Total 0.4 0.0 - 1.2 mg/dL   Alkaline Phosphatase 61 44 - 121 IU/L   AST 19 0 - 40 IU/L   ALT 30 0 - 44 IU/L  Lipid panel     Status: Abnormal   Collection Time: 05/10/23 11:14 AM  Result Value Ref Range   Cholesterol, Total 185 100 - 199 mg/dL   Triglycerides 77 0 - 149 mg/dL   HDL 59 >95 mg/dL   VLDL Cholesterol Cal 14 5 - 40 mg/dL   LDL Chol Calc (NIH) 621 (H) 0 - 99 mg/dL   Chol/HDL Ratio 3.1 0.0 - 5.0 ratio    Comment:                                   T. Chol/HDL Ratio                                             Men  Women  1/2 Avg.Risk  3.4    3.3                                   Avg.Risk  5.0    4.4                                2X Avg.Risk  9.6    7.1                                3X Avg.Risk 23.4   11.0   POCT CBG (Fasting - Glucose)     Status: Abnormal   Collection Time: 05/14/23  9:22 AM  Result Value Ref Range   Glucose Fasting, POC 122 (A) 70 - 99 mg/dL      Assessment & Plan:  Take medications as prescribed.  Referral sent for DEE.  Return in one month for blood sugar check.   Problem List Items Addressed This Visit       Cardiovascular and Mediastinum   Essential hypertension   Relevant Medications   rosuvastatin (CRESTOR) 40 MG tablet   losartan (COZAAR) 25 MG tablet     Endocrine   DM hyperosmolarity type II, uncontrolled  (HCC)   Relevant Medications   rosuvastatin (CRESTOR) 40 MG tablet   losartan (COZAAR) 25 MG tablet   Other Relevant Orders   Ambulatory referral to Ophthalmology     Other   Pre-diabetes - Primary   Relevant Orders   POCT CBG (Fasting - Glucose) (Completed)    Return in about 4 weeks (around 06/11/2023).   Total time spent: 25 minutes  Google, NP  05/14/2023   This document may have been prepared by Dragon Voice Recognition software and as such may include unintentional dictation errors.

## 2023-06-11 ENCOUNTER — Ambulatory Visit (INDEPENDENT_AMBULATORY_CARE_PROVIDER_SITE_OTHER): Payer: Managed Care, Other (non HMO) | Admitting: Cardiology

## 2023-06-11 ENCOUNTER — Encounter: Payer: Self-pay | Admitting: Cardiology

## 2023-06-11 VITALS — BP 122/89 | HR 76 | Ht 74.0 in | Wt 235.4 lb

## 2023-06-11 DIAGNOSIS — I10 Essential (primary) hypertension: Secondary | ICD-10-CM | POA: Diagnosis not present

## 2023-06-11 DIAGNOSIS — E1100 Type 2 diabetes mellitus with hyperosmolarity without nonketotic hyperglycemic-hyperosmolar coma (NKHHC): Secondary | ICD-10-CM

## 2023-06-11 LAB — POCT CBG (FASTING - GLUCOSE)-MANUAL ENTRY: Glucose Fasting, POC: 97 mg/dL (ref 70–99)

## 2023-06-11 MED ORDER — NOVOLOG FLEXPEN 100 UNIT/ML ~~LOC~~ SOPN: PEN_INJECTOR | SUBCUTANEOUS | 11 refills | Status: DC

## 2023-06-11 MED ORDER — INSULIN GLARGINE 100 UNIT/ML SOLOSTAR PEN: 25.0000 [IU] | PEN_INJECTOR | Freq: Every day | SUBCUTANEOUS | 11 refills | Status: DC

## 2023-06-11 NOTE — Progress Notes (Signed)
Established Patient Office Visit  Subjective:  Patient ID: Benjamin Richardson, male    DOB: 09-06-1990  Age: 32 y.o. MRN: [id number]  Chief Complaint  Patient presents with   Follow-up    Patient in office for 4 week follow up. Patient reports feeling well, no complaints today. Patient has been compliant with insulin. Glucose today well controlled.  Patient has not heard from ophthalmology for DEE.     No other concerns at this time.   Past Medical History:  Diagnosis Date   DM hyperosmolarity type II, uncontrolled (HCC)    Elevated blood pressure reading without diagnosis of hypertension    Isolated hypertriglyceridemia    Obesity, Class I, BMI 30-34.9    Seizure (HCC)     History reviewed. No pertinent surgical history.  Social History   Socioeconomic History   Marital status: Single    Spouse name: Not on file   Number of children: Not on file   Years of education: Not on file   Highest education level: Not on file  Occupational History   Not on file  Tobacco Use   Smoking status: Never   Smokeless tobacco: Never  Substance and Sexual Activity   Alcohol use: Yes    Alcohol/week: 0.0 standard drinks of alcohol    Comment: ocassionally   Drug use: No   Sexual activity: Never  Other Topics Concern   Not on file  Social History Narrative   Not on file   Social Determinants of Health   Financial Resource Strain: Not on file  Food Insecurity: Not on file  Transportation Needs: Not on file  Physical Activity: Not on file  Stress: Not on file  Social Connections: Not on file  Intimate Partner Violence: Not on file    Family History  Problem Relation Age of Onset   Diabetes Mother     No Known Allergies  Review of Systems  Constitutional: Negative.   HENT: Negative.    Eyes: Negative.   Respiratory: Negative.  Negative for shortness of breath.   Cardiovascular: Negative.  Negative for chest pain.  Gastrointestinal: Negative.  Negative for  abdominal pain, constipation and diarrhea.  Genitourinary: Negative.   Musculoskeletal:  Negative for joint pain and myalgias.  Skin: Negative.   Neurological: Negative.  Negative for dizziness and headaches.  Endo/Heme/Allergies: Negative.   All other systems reviewed and are negative.      Objective:   BP 122/89   Pulse 76   Ht 6\' 2"  (1.88 m)   Wt 235 lb 6.4 oz (106.8 kg)   SpO2 97%   BMI 30.22 kg/m   Vitals:   06/11/23 0914  BP: 122/89  Pulse: 76  Height: 6\' 2"  (1.88 m)  Weight: 235 lb 6.4 oz (106.8 kg)  SpO2: 97%  BMI (Calculated): 30.21    Physical Exam Nursing note reviewed.  Constitutional:      Appearance: Normal appearance. He is normal weight.  HENT:     Head: Normocephalic and atraumatic.     Nose: Nose normal.     Mouth/Throat:     Mouth: Mucous membranes are moist.     Pharynx: Oropharynx is clear.  Eyes:     Extraocular Movements: Extraocular movements intact.     Conjunctiva/sclera: Conjunctivae normal.     Pupils: Pupils are equal, round, and reactive to light.  Cardiovascular:     Rate and Rhythm: Normal rate and regular rhythm.     Pulses: Normal pulses.  Heart sounds: Normal heart sounds.  Pulmonary:     Effort: Pulmonary effort is normal.     Breath sounds: Normal breath sounds.  Abdominal:     General: Abdomen is flat. Bowel sounds are normal.     Palpations: Abdomen is soft.  Musculoskeletal:        General: Normal range of motion.     Cervical back: Normal range of motion.  Skin:    General: Skin is warm and dry.  Neurological:     General: No focal deficit present.     Mental Status: He is alert and oriented to person, place, and time.  Psychiatric:        Mood and Affect: Mood normal.        Behavior: Behavior normal.        Thought Content: Thought content normal.        Judgment: Judgment normal.      Results for orders placed or performed in visit on 06/11/23  POCT CBG (Fasting - Glucose)  Result Value Ref Range    Glucose Fasting, POC 97 70 - 99 mg/dL    Recent Results (from the past 2160 hour(s))  CBC with Differential/Platelet     Status: Abnormal   Collection Time: 03/23/23  6:13 AM  Result Value Ref Range   WBC 6.6 4.0 - 10.5 K/uL   RBC 5.25 4.22 - 5.81 MIL/uL   Hemoglobin 14.4 13.0 - 17.0 g/dL   HCT 16.1 09.6 - 04.5 %   MCV 85.5 80.0 - 100.0 fL   MCH 27.4 26.0 - 34.0 pg   MCHC 32.1 30.0 - 36.0 g/dL   RDW 40.9 81.1 - 91.4 %   Platelets 101 (L) 150 - 400 K/uL    Comment: Immature Platelet Fraction may be clinically indicated, consider ordering this additional test [id number]    nRBC 0.0 0.0 - 0.2 %   Neutrophils Relative % 53 %   Neutro Abs 3.6 1.7 - 7.7 K/uL   Lymphocytes Relative 38 %   Lymphs Abs 2.5 0.7 - 4.0 K/uL   Monocytes Relative 7 %   Monocytes Absolute 0.5 0.1 - 1.0 K/uL   Eosinophils Relative 1 %   Eosinophils Absolute 0.0 0.0 - 0.5 K/uL   Basophils Relative 0 %   Basophils Absolute 0.0 0.0 - 0.1 K/uL   Immature Granulocytes 1 %   Abs Immature Granulocytes 0.03 0.00 - 0.07 K/uL    Comment: Performed at [facility], [address] [place], [place] [address]  Urinalysis, Routine w reflex microscopic -Urine, Clean Catch     Status: Abnormal   Collection Time: 03/23/23  6:13 AM  Result Value Ref Range   Color, Urine STRAW (A) YELLOW   APPearance CLEAR (A) CLEAR   Specific Gravity, Urine 1.030 1.005 - 1.030   pH 5.0 5.0 - 8.0   Glucose, UA >=500 (A) NEGATIVE mg/dL   Hgb urine dipstick NEGATIVE NEGATIVE   Bilirubin Urine NEGATIVE NEGATIVE   Ketones, ur NEGATIVE NEGATIVE mg/dL   Protein, ur NEGATIVE NEGATIVE mg/dL   Nitrite NEGATIVE NEGATIVE   Leukocytes,Ua NEGATIVE NEGATIVE   RBC / HPF 0-5 0 - 5 RBC/hpf   WBC, UA NONE SEEN 0 - 5 WBC/hpf   Bacteria, UA NONE SEEN NONE SEEN   Squamous Epithelial / HPF 0-5 0 - 5 /HPF    Comment: Performed at [facility], [address] [place], [place] [address]  Basic metabolic panel     Status: Abnormal  Collection Time: 03/23/23  7:36 AM  Result Value Ref Range   Sodium 135 135 - 145 mmol/L   Potassium 3.8 3.5 - 5.1 mmol/L   Chloride 104 98 - 111 mmol/L   CO2 23 22 - 32 mmol/L   Glucose, Bld 401 (H) 70 - 99 mg/dL    Comment: Glucose reference range applies only to samples taken after fasting for at least 8 hours.   BUN 10 6 - 20 mg/dL   Creatinine, Ser 1.61 0.61 - 1.24 mg/dL   Calcium 8.3 (L) 8.9 - 10.3 mg/dL   GFR, Estimated >09 >60 mL/min    Comment: (NOTE) Calculated using the CKD-EPI Creatinine Equation (2021)    Anion gap 8 5 - 15    Comment: Performed at [facility], [address] [place], [place] [address]  CBG monitoring, ED     Status: Abnormal   Collection Time: 03/23/23 10:12 AM  Result Value Ref Range   Glucose-Capillary 218 (H) 70 - 99 mg/dL    Comment: Glucose reference range applies only to samples taken after fasting for at least 8 hours.  CBC     Status: None   Collection Time: 05/04/23  5:34 AM  Result Value Ref Range   WBC 6.2 4.0 - 10.5 K/uL   RBC 5.66 4.22 - 5.81 MIL/uL   Hemoglobin 15.2 13.0 - 17.0 g/dL   HCT 81.1 91.4 - 78.2 %   MCV 81.6 80.0 - 100.0 fL   MCH 26.9 26.0 - 34.0 pg   MCHC 32.9 30.0 - 36.0 g/dL   RDW 95.6 21.3 - 08.6 %   Platelets 165 150 - 400 K/uL   nRBC 0.0 0.0 - 0.2 %    Comment: Performed at [facility], [address] [place], [place] [address]  Comprehensive metabolic panel     Status: Abnormal   Collection Time: 05/04/23  5:34 AM  Result Value Ref Range   Sodium 135 135 - 145 mmol/L   Potassium 4.2 3.5 - 5.1 mmol/L   Chloride 101 98 - 111 mmol/L   CO2 23 22 - 32 mmol/L   Glucose, Bld 543 (HH) 70 - 99 mg/dL    Comment: CRITICAL RESULT CALLED TO, READ BACK BY AND VERIFIED WITH Hunter orre @0650  on 05/04/23 by hkp Glucose reference range applies only to samples taken after fasting for at least 8 hours.    BUN 14 6 - 20 mg/dL   Creatinine, Ser 9.62 0.61 - 1.24 mg/dL   Calcium 9.3 8.9 - 95.2 mg/dL    Total Protein 7.3 6.5 - 8.1 g/dL   Albumin 4.3 3.5 - 5.0 g/dL   AST 28 15 - 41 U/L   ALT 31 0 - 44 U/L   Alkaline Phosphatase 59 38 - 126 U/L   Total Bilirubin 0.6 0.3 - 1.2 mg/dL   GFR, Estimated >84 >13 mL/min    Comment: (NOTE) Calculated using the CKD-EPI Creatinine Equation (2021)    Anion gap 11 5 - 15    Comment: Performed at [facility], [address] [place], [place] [address]  Troponin I (High Sensitivity)     Status: None   Collection Time: 05/04/23  5:34 AM  Result Value Ref Range   Troponin I (High Sensitivity) 5 <18 ng/L    Comment: (NOTE) Elevated high sensitivity troponin I (hsTnI) values and significant  changes across serial measurements may suggest ACS but many other  chronic and acute conditions are known to elevate hsTnI  results.  Refer to the "Links" section for chest pain algorithms and additional  guidance. Performed at [facility], [address] [place], [place] [address]   CBG monitoring, ED     Status: Abnormal   Collection Time: 05/04/23  7:35 AM  Result Value Ref Range   Glucose-Capillary 369 (H) 70 - 99 mg/dL    Comment: Glucose reference range applies only to samples taken after fasting for at least 8 hours.  Hemoglobin A1c     Status: Abnormal   Collection Time: 05/10/23 11:14 AM  Result Value Ref Range   Hgb A1c MFr Bld >15.5 (H) 4.8 - 5.6 %    Comment:          Prediabetes: 5.7 - 6.4          Diabetes: >6.4          Glycemic control for adults with diabetes: <7.0    Est. average glucose Bld gHb Est-mCnc >398 mg/dL  TSH     Status: None   Collection Time: 05/10/23 11:14 AM  Result Value Ref Range   TSH 0.646 0.450 - 4.500 uIU/mL  CMP14+EGFR     Status: Abnormal   Collection Time: 05/10/23 11:14 AM  Result Value Ref Range   Glucose 237 (H) 70 - 99 mg/dL   BUN 10 6 - 20 mg/dL   Creatinine, Ser 4.69 0.76 - 1.27 mg/dL   eGFR 629 >52 WU/XLK/4.40   BUN/Creatinine Ratio 11 9 - 20   Sodium 142 134 - 144 mmol/L    Potassium 4.7 3.5 - 5.2 mmol/L   Chloride 103 96 - 106 mmol/L   CO2 24 20 - 29 mmol/L   Calcium 9.7 8.7 - 10.2 mg/dL   Total Protein 6.2 6.0 - 8.5 g/dL   Albumin 4.1 4.1 - 5.1 g/dL   Globulin, Total 2.1 1.5 - 4.5 g/dL   Bilirubin Total 0.4 0.0 - 1.2 mg/dL   Alkaline Phosphatase 61 44 - 121 IU/L   AST 19 0 - 40 IU/L   ALT 30 0 - 44 IU/L  Lipid panel     Status: Abnormal   Collection Time: 05/10/23 11:14 AM  Result Value Ref Range   Cholesterol, Total 185 100 - 199 mg/dL   Triglycerides 77 0 - 149 mg/dL   HDL 59 >10 mg/dL   VLDL Cholesterol Cal 14 5 - 40 mg/dL   LDL Chol Calc (NIH) 272 (H) 0 - 99 mg/dL   Chol/HDL Ratio 3.1 0.0 - 5.0 ratio    Comment:                                   T. Chol/HDL Ratio                                             Men  Women                               1/2 Avg.Risk  3.4    3.3                                   Avg.Risk  5.0    4.4  2X Avg.Risk  9.6    7.1                                3X Avg.Risk 23.4   11.0   POCT CBG (Fasting - Glucose)     Status: Abnormal   Collection Time: 05/14/23  9:22 AM  Result Value Ref Range   Glucose Fasting, POC 122 (A) 70 - 99 mg/dL  POCT CBG (Fasting - Glucose)     Status: None   Collection Time: 06/11/23  9:19 AM  Result Value Ref Range   Glucose Fasting, POC 97 70 - 99 mg/dL      Assessment & Plan:  Continue same medications. Call to schedule DEE.  Problem List Items Addressed This Visit       Cardiovascular and Mediastinum   Essential hypertension     Endocrine   DM hyperosmolarity type II, uncontrolled (HCC) - Primary   Relevant Medications   insulin glargine (LANTUS) 100 UNIT/ML Solostar Pen   insulin aspart (NOVOLOG FLEXPEN) 100 UNIT/ML FlexPen   Other Relevant Orders   POCT CBG (Fasting - Glucose) (Completed)    Return in about 3 months (around 09/11/2023) for with fasting labs prior.   Total time spent: 25 minutes  Google, NP  06/11/2023   This  document may have been prepared by Dragon Voice Recognition software and as such may include unintentional dictation errors.

## 2023-09-06 ENCOUNTER — Other Ambulatory Visit: Payer: Managed Care, Other (non HMO)

## 2023-09-06 DIAGNOSIS — E781 Pure hyperglyceridemia: Secondary | ICD-10-CM

## 2023-09-06 DIAGNOSIS — I10 Essential (primary) hypertension: Secondary | ICD-10-CM

## 2023-09-06 DIAGNOSIS — E1100 Type 2 diabetes mellitus with hyperosmolarity without nonketotic hyperglycemic-hyperosmolar coma (NKHHC): Secondary | ICD-10-CM

## 2023-09-07 LAB — CMP14+EGFR
ALT: 35 [IU]/L (ref 0–44)
AST: 22 [IU]/L (ref 0–40)
Albumin: 4.5 g/dL (ref 4.1–5.1)
Alkaline Phosphatase: 69 [IU]/L (ref 44–121)
BUN/Creatinine Ratio: 10 (ref 9–20)
BUN: 11 mg/dL (ref 6–20)
Bilirubin Total: 0.4 mg/dL (ref 0.0–1.2)
CO2: 28 mmol/L (ref 20–29)
Calcium: 9.7 mg/dL (ref 8.7–10.2)
Chloride: 103 mmol/L (ref 96–106)
Creatinine, Ser: 1.15 mg/dL (ref 0.76–1.27)
Globulin, Total: 2.1 g/dL (ref 1.5–4.5)
Glucose: 242 mg/dL — ABNORMAL HIGH (ref 70–99)
Potassium: 4.2 mmol/L (ref 3.5–5.2)
Sodium: 142 mmol/L (ref 134–144)
Total Protein: 6.6 g/dL (ref 6.0–8.5)
eGFR: 87 mL/min/{1.73_m2} (ref >59)

## 2023-09-07 LAB — LIPID PANEL
Chol/HDL Ratio: 4.1 {ratio} (ref 0.0–5.0)
Cholesterol, Total: 195 mg/dL (ref 100–199)
HDL: 48 mg/dL (ref >39)
LDL Chol Calc (NIH): 135 mg/dL — ABNORMAL HIGH (ref 0–99)
Triglycerides: 67 mg/dL (ref 0–149)
VLDL Cholesterol Cal: 12 mg/dL (ref 5–40)

## 2023-09-07 LAB — TSH: TSH: 0.683 u[IU]/mL (ref 0.450–4.500)

## 2023-09-07 LAB — HEMOGLOBIN A1C
Est. average glucose Bld gHb Est-mCnc: 346 mg/dL
Hgb A1c MFr Bld: 13.7 % — ABNORMAL HIGH (ref 4.8–5.6)

## 2023-09-10 ENCOUNTER — Ambulatory Visit: Payer: Managed Care, Other (non HMO) | Admitting: Cardiology

## 2023-09-16 ENCOUNTER — Ambulatory Visit (INDEPENDENT_AMBULATORY_CARE_PROVIDER_SITE_OTHER): Payer: Managed Care, Other (non HMO) | Admitting: Cardiology

## 2023-09-16 ENCOUNTER — Encounter: Payer: Self-pay | Admitting: Cardiology

## 2023-09-16 VITALS — BP 130/94 | HR 94 | Ht 74.0 in | Wt 242.0 lb

## 2023-09-16 DIAGNOSIS — E1100 Type 2 diabetes mellitus with hyperosmolarity without nonketotic hyperglycemic-hyperosmolar coma (NKHHC): Secondary | ICD-10-CM

## 2023-09-16 DIAGNOSIS — I10 Essential (primary) hypertension: Secondary | ICD-10-CM

## 2023-09-16 DIAGNOSIS — E781 Pure hyperglyceridemia: Secondary | ICD-10-CM | POA: Diagnosis not present

## 2023-09-16 MED ORDER — INSULIN GLARGINE 100 UNIT/ML SOLOSTAR PEN: 25.0000 [IU] | PEN_INJECTOR | Freq: Every day | SUBCUTANEOUS | 11 refills | Status: DC

## 2023-09-16 MED ORDER — NOVOLOG FLEXPEN 100 UNIT/ML ~~LOC~~ SOPN: PEN_INJECTOR | SUBCUTANEOUS | 11 refills | Status: AC

## 2023-09-16 MED ORDER — NOVOLOG FLEXPEN 100 UNIT/ML ~~LOC~~ SOPN: PEN_INJECTOR | SUBCUTANEOUS | 11 refills | Status: DC

## 2023-09-16 NOTE — Progress Notes (Signed)
Established Patient Office Visit  Subjective:  Patient ID: Benjamin Richardson, male    DOB: 1991/08/15  Age: 33 y.o. MRN: [id number]  Chief Complaint  Patient presents with   Follow-up    3 Months Lab Results    Patient in office for 3 month follow up, discuss recent lab work. Patient feeling well. No complaints. Discussed recent lab work. Patient has not been taking his insulin as prescribed. States it was expired. Insulin refilled. Discussed importance of getting Hgb A1c under control. LDL elevated. States he is taking his rosuvastatin as prescribed.  Due for DEE, referral placed.     No other concerns at this time.   Past Medical History:  Diagnosis Date   DM hyperosmolarity type II, uncontrolled (HCC)    Elevated blood pressure reading without diagnosis of hypertension    Isolated hypertriglyceridemia    Obesity, Class I, BMI 30-34.9    Pre-diabetes 03/28/2015   Seizure (HCC)     History reviewed. No pertinent surgical history.  Social History   Socioeconomic History   Marital status: Single    Spouse name: Not on file   Number of children: Not on file   Years of education: Not on file   Highest education level: Not on file  Occupational History   Not on file  Tobacco Use   Smoking status: Never   Smokeless tobacco: Never  Substance and Sexual Activity   Alcohol use: Yes    Alcohol/week: 0.0 standard drinks of alcohol    Comment: ocassionally   Drug use: No   Sexual activity: Never  Other Topics Concern   Not on file  Social History Narrative   Not on file   Social Drivers of Health   Financial Resource Strain: Not on file  Food Insecurity: Not on file  Transportation Needs: Not on file  Physical Activity: Not on file  Stress: Not on file  Social Connections: Not on file  Intimate Partner Violence: Not on file    Family History  Problem Relation Age of Onset   Diabetes Mother     No Known Allergies  Outpatient Medications Prior to Visit   Medication Sig   Insulin Pen Needle 32G X 4 MM MISC 1 Dose by Does not apply route in the morning, at noon, in the evening, and at bedtime.   lamoTRIgine (LAMICTAL) 25 MG tablet Take 25 mg by mouth as directed.   levETIRAcetam (KEPPRA) 750 MG tablet Take 1 tablet (750 mg total) by mouth 2 (two) times daily.   losartan (COZAAR) 25 MG tablet Take 1 tablet (25 mg total) by mouth daily.   metFORMIN (GLUCOPHAGE) 500 MG tablet Take 1 tablet (500 mg total) by mouth 2 (two) times daily with a meal.   rosuvastatin (CRESTOR) 40 MG tablet Take 40 mg by mouth daily.   Vitamin D, Ergocalciferol, (DRISDOL) 1.25 MG (50000 UNIT) CAPS capsule Take 1 capsule (50,000 Units total) by mouth every 7 (seven) days.   [DISCONTINUED] insulin aspart (NOVOLOG FLEXPEN) 100 UNIT/ML FlexPen Administer a sliding scale insulin dose based upon your CBG values 3 times a day with meals, according to the following scale: CBG 70-120 = 0 units CBG 121-150 = 1 unit CBG 151-200 = 2 units CBG 201-250 = 3 units CBG 251-300 = 5 units CBG 301-350 = 7 units CBG 351-400 = 9 units CBG > 400 = consult with your doctor   [DISCONTINUED] insulin glargine (LANTUS) 100 UNIT/ML Solostar Pen Inject 25 Units into the skin  daily.   No facility-administered medications prior to visit.    Review of Systems  Constitutional: Negative.   HENT: Negative.    Eyes: Negative.   Respiratory: Negative.  Negative for shortness of breath.   Cardiovascular: Negative.  Negative for chest pain.  Gastrointestinal: Negative.  Negative for abdominal pain, constipation and diarrhea.  Genitourinary: Negative.   Musculoskeletal:  Negative for joint pain and myalgias.  Skin: Negative.   Neurological: Negative.  Negative for dizziness and headaches.  Endo/Heme/Allergies: Negative.   All other systems reviewed and are negative.      Objective:   BP (!) 130/94   Pulse 94   Ht 6\' 2"  (1.88 m)   Wt 242 lb (109.8 kg)   SpO2 97%   BMI 31.07 kg/m    Vitals:   09/16/23 1101  BP: (!) 130/94  Pulse: 94  Height: 6\' 2"  (1.88 m)  Weight: 242 lb (109.8 kg)  SpO2: 97%  BMI (Calculated): 31.06    Physical Exam Nursing note reviewed.  Constitutional:      Appearance: Normal appearance. He is normal weight.  HENT:     Head: Normocephalic and atraumatic.     Nose: Nose normal.     Mouth/Throat:     Mouth: Mucous membranes are moist.     Pharynx: Oropharynx is clear.  Eyes:     Extraocular Movements: Extraocular movements intact.     Conjunctiva/sclera: Conjunctivae normal.     Pupils: Pupils are equal, round, and reactive to light.  Cardiovascular:     Rate and Rhythm: Normal rate and regular rhythm.     Pulses: Normal pulses.     Heart sounds: Normal heart sounds.  Pulmonary:     Effort: Pulmonary effort is normal.     Breath sounds: Normal breath sounds.  Abdominal:     General: Abdomen is flat. Bowel sounds are normal.     Palpations: Abdomen is soft.  Musculoskeletal:        General: Normal range of motion.     Cervical back: Normal range of motion.  Skin:    General: Skin is warm and dry.  Neurological:     General: No focal deficit present.     Mental Status: He is alert and oriented to person, place, and time.  Psychiatric:        Mood and Affect: Mood normal.        Behavior: Behavior normal.        Thought Content: Thought content normal.        Judgment: Judgment normal.      No results found for any visits on 09/16/23.  Recent Results (from the past 2160 hours)  Hemoglobin A1c     Status: Abnormal   Collection Time: 09/06/23 10:24 AM  Result Value Ref Range   Hgb A1c MFr Bld 13.7 (H) 4.8 - 5.6 %    Comment:          Prediabetes: 5.7 - 6.4          Diabetes: >6.4          Glycemic control for adults with diabetes: <7.0    Est. average glucose Bld gHb Est-mCnc 346 mg/dL  TSH     Status: None   Collection Time: 09/06/23 10:24 AM  Result Value Ref Range   TSH 0.683 0.450 - 4.500 uIU/mL   CMP14+EGFR     Status: Abnormal   Collection Time: 09/06/23 10:24 AM  Result Value Ref Range   Glucose  242 (H) 70 - 99 mg/dL   BUN 11 6 - 20 mg/dL   Creatinine, Ser 1.61 0.76 - 1.27 mg/dL   eGFR 87 >09 UE/AVW/0.98   BUN/Creatinine Ratio 10 9 - 20   Sodium 142 134 - 144 mmol/L   Potassium 4.2 3.5 - 5.2 mmol/L   Chloride 103 96 - 106 mmol/L   CO2 28 20 - 29 mmol/L   Calcium 9.7 8.7 - 10.2 mg/dL   Total Protein 6.6 6.0 - 8.5 g/dL   Albumin 4.5 4.1 - 5.1 g/dL   Globulin, Total 2.1 1.5 - 4.5 g/dL   Bilirubin Total 0.4 0.0 - 1.2 mg/dL   Alkaline Phosphatase 69 44 - 121 IU/L   AST 22 0 - 40 IU/L   ALT 35 0 - 44 IU/L  Lipid panel     Status: Abnormal   Collection Time: 09/06/23 10:24 AM  Result Value Ref Range   Cholesterol, Total 195 100 - 199 mg/dL   Triglycerides 67 0 - 149 mg/dL   HDL 48 >11 mg/dL   VLDL Cholesterol Cal 12 5 - 40 mg/dL   LDL Chol Calc (NIH) 914 (H) 0 - 99 mg/dL   Chol/HDL Ratio 4.1 0.0 - 5.0 ratio    Comment:                                   T. Chol/HDL Ratio                                             Men  Women                               1/2 Avg.Risk  3.4    3.3                                   Avg.Risk  5.0    4.4                                2X Avg.Risk  9.6    7.1                                3X Avg.Risk 23.4   11.0       Assessment & Plan:  Continue same medications. Schedule DEE.   Problem List Items Addressed This Visit       Cardiovascular and Mediastinum   Essential hypertension - Primary     Endocrine   DM hyperosmolarity type II, uncontrolled (HCC)   Relevant Medications   insulin glargine (LANTUS) 100 UNIT/ML Solostar Pen   insulin aspart (NOVOLOG FLEXPEN) 100 UNIT/ML FlexPen   Other Relevant Orders   Ambulatory referral to Ophthalmology     Other   Hypertriglyceridemia    Return in about 4 months (around 01/14/2024) for with fasting labs prior.   Total time spent: 25 minutes  [employer],  NP  09/16/2023   This document may have been prepared by Dragon Voice Recognition software and as such may include unintentional dictation errors.

## 2023-09-16 NOTE — Patient Instructions (Signed)
[facility] [address] [place], [place]  [address]  Phone: [phone] FAX:  [phone]

## 2023-11-17 ENCOUNTER — Encounter: Payer: Self-pay | Admitting: Cardiology

## 2023-12-21 NOTE — Progress Notes (Signed)
 Today the history is gathered from: 100% - patient  0% - alone   REFERRING PHYSICIAN: Pcp PRIMARY CARE PHYSICIAN:  Richarda Shell, MD   IMPRESSION/PLAN  Benjamin Richardson is a 33 y.o. male presenting for evaluation of  HISTORY OF SEIZURE/  - Stable - Patient with no recent seizure activity since September 2025. Seizure event in 04/2023 was secondary to hyperglycemic event. Sleep and mood are well.  - Increase Lamictal  to 50 mg twice daily for seizure activity. Refill. - Continue Keppra  750 mg twice daily for seizure activity. Refill. - No further EEG testing recommended as prior seizure activity consistent with provoked episode secondary to hyperglycemic episode. Recommend managing blood sugar.  Last seen greater than 3 years ago.  Follow up in 6-12 mo  CHIEF COMPLAIN & HISTORY OF PRESENT ILLNESS:  Benjamin Richardson is a 33 y.o.  male presenting for evaluation of: Chief Complaint  Patient presents with  . HISTORY OF SEIZURE   HISTORY OF SEIZURE/  Patient with last known seizure event in September 2025. No sleep difficulty, headaches, mood change, imbalance, unstable gait, numbness and tingling  in upper and lower extremities. Taking Lamictal  25 mg in the morning and 50 mg nightly, Keppra  750 mg BID.   DATA SUMMARY 06/19/2023 ROUTINE EEG IMPRESSION: This routine EEG in the awake and drowsy states is within normal limits.  05/04/2023 CT HEAD WO CONTRAST IMPRESSION:  1. No acute intracranial abnormalities.  2. Healing nondisplaced fracture of the left frontal bone originally  identified on prior head CT 03/23/2023.   03/23/2023 CT HEAD WO CONTRAST\ IMPRESSION:  1. Small left frontal scalp hematoma, with nondisplaced fracture of  the left frontal bone which extends from the left orbital ridge  posteromedially along the floor of the left anterior cranial fossa.  2. No evidence of significant acute traumatic injury to the brain.  No intra or extra-axial hemorrhage.   07/01/2022  CT  HEAD WO CONTRAST CT CERVICAL SPIN WO CONTRAST Brain:  No evidence of large-territorial acute infarction. No parenchymal  hemorrhage. No mass lesion. No extra-axial collection.  No mass effect or midline shift. No hydrocephalus. Basilar cisterns  are patent.  Vascular: No hyperdense vessel.  Skull: No acute fracture or focal lesion.  Sinuses/Orbits: Paranasal sinuses and mastoid air cells are clear.  The orbits are unremarkable.  Other: 12 mm left frontal scalp hematoma formation.  IMPRESSION:  1. No acute intracranial abnormality.  2. No acute displaced fracture or traumatic listhesis of the  cervical spine.  3. A 12 mm left frontal scalp hematoma formation    MEDICATIONS Current Outpatient Medications  Medication Sig Dispense Refill  . calcium carbonate-vitamin D3 (OS-CAL 500+D) 500 mg-10 mcg (400 unit) tablet Take 1 tablet by mouth every 7 (seven) days    . lamoTRIgine  (LAMICTAL ) 25 MG tablet TAKE 50 MG ( 2 TABS) TWICE A DAY 360 tablet 2  . levETIRAcetam  (KEPPRA ) 750 MG tablet TAKE 1 TABLET BY MOUTH TWICE DAILY 180 tablet 2  . metFORMIN  (GLUCOPHAGE ) 500 MG tablet Take 1 tablet (500 mg total) by mouth 2 (two) times daily with meals     No current facility-administered medications for this visit.    ALLERGIES No Known Allergies   EXAM   Vitals:   12/21/23 1010  BP: (!) 142/100  Weight: (!) 108 kg (238 lb)  Height: 188 cm (6' 2)  PainSc: 0-No pain     Body mass index is 30.56 kg/m.  GENERAL: Very pleasant male, NAD. Normocephalic and atraumatic.  Baseline neurological exam below was obtained at prior office visit. Changes from today's visit appear in bold.    MUSCULOSKELETAL: Bulk - Obese Tone - Normal Pronator Drift - Absent bilaterally. Ambulation - Gait and station are steady Romberg - not tested today  R/L 5/5    Shoulder abduction (deltoid/supraspinatus, axillary/suprascapular n, C5) 5/5    Elbow flexion (biceps brachii, musculoskeletal n,  C5-6) 5/5    Elbow extension (triceps, radial n, C7) 5/5    Finger adduction (interossei, ulnar n, T1)  5/5    Hip flexion (iliopsoas, L1/L2) 5/5    Knee flexion (hamstrings, sciatic n, L5/S1)  5/5    Knee extension (quadriceps, femoral n, L3/4) 5/5    Ankle dorsiflexion (tibialis anterior, deep fibular n, L4/5) 5/5    Ankle plantarflexion (gastroc, tibial n, S1)   NEUROLOGICAL: MENTAL STATUS: Patient is oriented to person, place and time.   Recent memory is intact.   Remote memory is intact.   Attention span and concentration are intact.   Naming and repetition are intact. Comprehension is intact.   Expressive speech is intact.   Patient's fund of knowledge is normal for educational level.  CRANIAL NERVES: Visual acuity and visual fields are intact         Extraocular muscles are intact                        Facial sensation is intact bilaterally                Facial strength is intact bilaterally                   Hearing is intact bilaterally                              Palate elevates midline, normal phonation     Shoulder shrug strength is intact                     COORDINATION/CEREBELLAR: Finger to nose testing is not tested today     PAST MEDICAL HISTORY Past Medical History:  Diagnosis Date  . Seizures (CMS/HHS-HCC)    PAST SURGICAL HISTORY History reviewed. No pertinent surgical history.  FAMILY HISTORY Family History  Problem Relation Name Age of Onset  . Diabetes Mother    . High blood pressure (Hypertension) Father     SOCIAL HISTORY  Social History   Tobacco Use  . Smoking status: Never  . Smokeless tobacco: Never  Vaping Use  . Vaping status: Never Used  Substance Use Topics  . Alcohol use: Not Currently    Comment: occasional  . Drug use: No    REVIEW OF SYSTEMS:  13 system ROS was verbally reviewed with patient. Pertinent positives and negatives are mentioned above in the HPI and all other systems are negative.  DATA    IMAGING 07/22/2015 Seizure  EXAM: CT HEAD WITHOUT CONTRAST  TECHNIQUE: Contiguous axial images were obtained from the base of the skull through the vertex without intravenous contrast.  COMPARISON:  None.  FINDINGS: The ventricles are normal in size and configuration. There is a focal area of decreased attenuation in the medial superior right temporal lobe measuring 8 x 5 mm, possibly a small arachnoid cyst. There is no other evidence suggesting mass. There is no hemorrhage, extra-axial fluid collection, or midline shift. Elnor and white compartments elsewhere appear normal. No acute infarct  evident. No edema seen. The bony calvarium appears intact. The mastoid air cells are clear. Visualized orbits appear normal. There is a small retention cyst in the anterior superior left maxillary antrum.  IMPRESSION: Probable subcentimeter arachnoid cyst in the medial superior left temporal lobe, a benign finding. Gray-white compartments otherwise appear normal. No acute infarct. No hemorrhage or edema. Small retention cyst in the left maxillary antrum.   TEST: 12/27/15 EEG IMPRESSION: This 65 hour 44 minute (72 hour) ambulatory video EEG in the awake and asleep states is within normal limits. Personally interpreted by Dr Arthea Farrow  09/11/2015 Personally interpreted 3-4 hour EEG (electroencephalogram) was within normal limits.   LABS: 07/22/2015 Tox screen: negative CBC: within normal limits CMP: glucose 105, total bilirubin <0.1, eGFR within normal limits, sodium within normal limits,potassium within normal limits,creatinine within normal limits Alcohol, ethyl H9  No visits with results within 6 Month(s) from this visit.  Latest known visit with results is:  Office Visit on 09/18/2022  Component Date Value Ref Range Status  . Levetiracetam , S - [facility] 09/18/2022 4.0 (L)  10.0 - 40.0 ug/mL Final  . WBC (White Blood Cell Count) 09/18/2022 5.3  4.1 - 10.2 10^3/uL Final  .  RBC (Red Blood Cell Count) 09/18/2022 5.10  4.69 - 6.13 10^6/uL Final  . Hemoglobin 09/18/2022 14.0 (L)  14.1 - 18.1 gm/dL Final  . Hematocrit 97/97/7975 42.5  40.0 - 52.0 % Final  . MCV (Mean Corpuscular Volume) 09/18/2022 83.3  80.0 - 100.0 fl Final  . MCH (Mean Corpuscular Hemoglobin) 09/18/2022 27.5  27.0 - 31.2 pg Final  . MCHC (Mean Corpuscular Hemoglobin * 09/18/2022 32.9  32.0 - 36.0 gm/dL Final  . Platelet Count 09/18/2022 141 (L)  150 - 450 10^3/uL Final  . RDW-CV (Red Cell Distribution Widt* 09/18/2022 12.8  11.6 - 14.8 % Final  . MPV (Mean Platelet Volume) 09/18/2022 13.0 (H)  9.4 - 12.4 fl Final  . Glucose 09/18/2022 678 (HH)  70 - 110 mg/dL Final  . Sodium 97/97/7975 132 (L)  136 - 145 mmol/L Final  . Potassium 09/18/2022 4.7  3.6 - 5.1 mmol/L Final  . Chloride 09/18/2022 97  97 - 109 mmol/L Final  . Carbon Dioxide (CO2) 09/18/2022 29.2  22.0 - 32.0 mmol/L Final  . Urea Nitrogen (BUN) 09/18/2022 11  7 - 25 mg/dL Final  . Creatinine 97/97/7975 1.0  0.7 - 1.3 mg/dL Final  . Glomerular Filtration Rate (eGFR) 09/18/2022 103  >60 mL/min/1.73sq m Final  . Calcium 09/18/2022 9.1  8.7 - 10.3 mg/dL Final  . AST  97/97/7975 17  8 - 39 U/L Final  . ALT  09/18/2022 27  6 - 57 U/L Final  . Alk Phos (alkaline Phosphatase) 09/18/2022 68  34 - 104 U/L Final  . Albumin 09/18/2022 4.5  3.5 - 4.8 g/dL Final  . Bilirubin, Total 09/18/2022 0.5  0.3 - 1.2 mg/dL Final  . Protein, Total 09/18/2022 6.4  6.1 - 7.9 g/dL Final  . A/G Ratio 97/97/7975 2.4  1.0 - 5.0 gm/dL Final    No follow-ups on file.  Payor: [employer] / Plan: [employer] OPEN ACCESS POS / Product Type: POS /   This note is partially written by Greig Pouch, scribe, in the presence of and acting as the scribe of Dr. Arthea Farrow.   I have reviewed, edited and added to the note as needed to reflect my best personal medical judgment.    Dr. Arthea Farrow, MD [facility] [place], [place] Ph:  [phone] Fax:  [phone]

## 2024-01-14 ENCOUNTER — Ambulatory Visit: Payer: Managed Care, Other (non HMO) | Admitting: Cardiology

## 2024-01-14 ENCOUNTER — Encounter: Payer: Self-pay | Admitting: Cardiology

## 2024-01-14 VITALS — BP 116/64 | HR 74 | Ht 74.0 in | Wt 229.0 lb

## 2024-01-14 DIAGNOSIS — E1100 Type 2 diabetes mellitus with hyperosmolarity without nonketotic hyperglycemic-hyperosmolar coma (NKHHC): Secondary | ICD-10-CM

## 2024-01-14 DIAGNOSIS — E66812 Obesity, class 2: Secondary | ICD-10-CM | POA: Diagnosis not present

## 2024-01-14 DIAGNOSIS — I10 Essential (primary) hypertension: Secondary | ICD-10-CM

## 2024-01-14 DIAGNOSIS — Z13220 Encounter for screening for lipoid disorders: Secondary | ICD-10-CM | POA: Diagnosis not present

## 2024-01-14 DIAGNOSIS — E781 Pure hyperglyceridemia: Secondary | ICD-10-CM

## 2024-01-14 DIAGNOSIS — Z1329 Encounter for screening for other suspected endocrine disorder: Secondary | ICD-10-CM

## 2024-01-14 NOTE — Progress Notes (Signed)
 Established Patient Office Visit  Subjective:  Patient ID: Benjamin Richardson, male    DOB: [birth year]  Age: 33 y.o. MRN: [id number]  Chief Complaint  Patient presents with   Follow-up    Follow Up    Patient in office for 4 month follow up, did not have fasting lab work done. Patient doing well, no complaints today. Unable to give a urine sample today. Will get fasting labs today and call with results.     No other concerns at this time.   Past Medical History:  Diagnosis Date   DM hyperosmolarity type II, uncontrolled (HCC)    Elevated blood pressure reading without diagnosis of hypertension    Isolated hypertriglyceridemia    Obesity, Class I, BMI 30-34.9    Pre-diabetes 03/28/2015   Seizure (HCC)     History reviewed. No pertinent surgical history.  Social History   Socioeconomic History   Marital status: Single    Spouse name: Not on file   Number of children: Not on file   Years of education: Not on file   Highest education level: Not on file  Occupational History   Not on file  Tobacco Use   Smoking status: Never   Smokeless tobacco: Never  Substance and Sexual Activity   Alcohol use: Yes    Alcohol/week: 0.0 standard drinks of alcohol    Comment: ocassionally   Drug use: No   Sexual activity: Never  Other Topics Concern   Not on file  Social History Narrative   Not on file   Social Drivers of Health   Financial Resource Strain: Not on file  Food Insecurity: Not on file  Transportation Needs: Not on file  Physical Activity: Not on file  Stress: Not on file  Social Connections: Not on file  Intimate Partner Violence: Not on file    Family History  Problem Relation Age of Onset   Diabetes Mother     No Known Allergies  Outpatient Medications Prior to Visit  Medication Sig   insulin  aspart (NOVOLOG  FLEXPEN) 100 UNIT/ML FlexPen Administer a sliding scale insulin  dose based upon your CBG values 3 times a day with meals, according to the  following scale: CBG 70-120 = 0 units CBG 121-150 = 1 unit CBG 151-200 = 2 units CBG 201-250 = 3 units CBG 251-300 = 5 units CBG 301-350 = 7 units CBG 351-400 = 9 units CBG > 400 = consult with your doctor   insulin  glargine (LANTUS ) 100 UNIT/ML Solostar Pen Inject 25 Units into the skin daily.   Insulin  Pen Needle 32G X 4 MM MISC 1 Dose by Does not apply route in the morning, at noon, in the evening, and at bedtime.   lamoTRIgine (LAMICTAL) 25 MG tablet Take 50 mg by mouth 2 (two) times daily.   levETIRAcetam  (KEPPRA ) 750 MG tablet Take 1 tablet (750 mg total) by mouth 2 (two) times daily.   losartan  (COZAAR ) 25 MG tablet Take 1 tablet (25 mg total) by mouth daily.   metFORMIN  (GLUCOPHAGE ) 500 MG tablet Take 1 tablet (500 mg total) by mouth 2 (two) times daily with a meal.   rosuvastatin (CRESTOR) 40 MG tablet Take 40 mg by mouth daily.   Vitamin D , Ergocalciferol , (DRISDOL ) 1.25 MG (50000 UNIT) CAPS capsule Take 1 capsule (50,000 Units total) by mouth every 7 (seven) days.   No facility-administered medications prior to visit.    Review of Systems  Constitutional: Negative.   HENT: Negative.  Eyes: Negative.   Respiratory: Negative.  Negative for shortness of breath.   Cardiovascular: Negative.  Negative for chest pain.  Gastrointestinal: Negative.  Negative for abdominal pain, constipation and diarrhea.  Genitourinary: Negative.   Musculoskeletal:  Negative for joint pain and myalgias.  Skin: Negative.   Neurological: Negative.  Negative for dizziness and headaches.  Endo/Heme/Allergies: Negative.   All other systems reviewed and are negative.      Objective:   BP 116/64   Pulse 74   Ht 6\' 2"  (1.88 m)   Wt 229 lb (103.9 kg)   SpO2 97%   BMI 29.40 kg/m   Vitals:   01/14/24 1109  BP: 116/64  Pulse: 74  Height: 6\' 2"  (1.88 m)  Weight: 229 lb (103.9 kg)  SpO2: 97%  BMI (Calculated): 29.39    Physical Exam Nursing note reviewed.  Constitutional:      Appearance:  Normal appearance. He is normal weight.  HENT:     Head: Normocephalic and atraumatic.     Nose: Nose normal.     Mouth/Throat:     Mouth: Mucous membranes are moist.     Pharynx: Oropharynx is clear.  Eyes:     Extraocular Movements: Extraocular movements intact.     Conjunctiva/sclera: Conjunctivae normal.     Pupils: Pupils are equal, round, and reactive to light.  Cardiovascular:     Rate and Rhythm: Normal rate and regular rhythm.     Pulses: Normal pulses.     Heart sounds: Normal heart sounds.  Pulmonary:     Effort: Pulmonary effort is normal.     Breath sounds: Normal breath sounds.  Abdominal:     General: Abdomen is flat. Bowel sounds are normal.     Palpations: Abdomen is soft.  Musculoskeletal:        General: Normal range of motion.     Cervical back: Normal range of motion.  Skin:    General: Skin is warm and dry.  Neurological:     General: No focal deficit present.     Mental Status: He is alert and oriented to person, place, and time.  Psychiatric:        Mood and Affect: Mood normal.        Behavior: Behavior normal.        Thought Content: Thought content normal.        Judgment: Judgment normal.      No results found for any visits on 01/14/24.  No results found for this or any previous visit (from the past 2160 hours).    Assessment & Plan:  Fasting lab work today. Will call with results. Continue same medications.   Problem List Items Addressed This Visit       Cardiovascular and Mediastinum   Essential hypertension - Primary   Relevant Orders   CMP14+EGFR     Endocrine   DM hyperosmolarity type II, uncontrolled (HCC)   Relevant Orders   CMP14+EGFR   Hemoglobin A1c     Other   Hypertriglyceridemia   Obesity, Class II, BMI 35-39.9   Other Visit Diagnoses       Lipid screening       Relevant Orders   Lipid Profile     Thyroid disorder screening       Relevant Orders   TSH       Return in about 4 months (around  05/16/2024) for fasing labs prior.   Total time spent: 25 minutes  Alica Antu, NP  01/14/2024   This document may have been prepared by [employer] Voice Recognition software and as such may include unintentional dictation errors.

## 2024-01-16 LAB — CMP14+EGFR
ALT: 37 IU/L (ref 0–44)
AST: 26 IU/L (ref 0–40)
Albumin: 4.7 g/dL (ref 4.1–5.1)
Alkaline Phosphatase: 68 IU/L (ref 44–121)
BUN/Creatinine Ratio: 8 — ABNORMAL LOW (ref 9–20)
BUN: 9 mg/dL (ref 6–20)
Bilirubin Total: 0.5 mg/dL (ref 0.0–1.2)
CO2: 23 mmol/L (ref 20–29)
Calcium: 10.1 mg/dL (ref 8.7–10.2)
Chloride: 99 mmol/L (ref 96–106)
Creatinine, Ser: 1.08 mg/dL (ref 0.76–1.27)
Globulin, Total: 2.1 g/dL (ref 1.5–4.5)
Glucose: 273 mg/dL — ABNORMAL HIGH (ref 70–99)
Potassium: 3.9 mmol/L (ref 3.5–5.2)
Sodium: 138 mmol/L (ref 134–144)
Total Protein: 6.8 g/dL (ref 6.0–8.5)
eGFR: 93 mL/min/{1.73_m2} (ref >59)

## 2024-01-16 LAB — HEMOGLOBIN A1C
Est. average glucose Bld gHb Est-mCnc: >398 mg/dL
Hgb A1c MFr Bld: >15.5 % — ABNORMAL HIGH (ref 4.8–5.6)

## 2024-01-16 LAB — LIPID PANEL
Chol/HDL Ratio: 3.4 ratio (ref 0.0–5.0)
Cholesterol, Total: 191 mg/dL (ref 100–199)
HDL: 56 mg/dL (ref >39)
LDL Chol Calc (NIH): 122 mg/dL — ABNORMAL HIGH (ref 0–99)
Triglycerides: 71 mg/dL (ref 0–149)
VLDL Cholesterol Cal: 13 mg/dL (ref 5–40)

## 2024-01-16 LAB — TSH: TSH: 1.13 u[IU]/mL (ref 0.450–4.500)

## 2024-01-17 ENCOUNTER — Other Ambulatory Visit: Payer: Self-pay | Admitting: Cardiology

## 2024-01-17 ENCOUNTER — Ambulatory Visit: Payer: Self-pay | Admitting: Cardiology

## 2024-01-17 DIAGNOSIS — E1100 Type 2 diabetes mellitus with hyperosmolarity without nonketotic hyperglycemic-hyperosmolar coma (NKHHC): Secondary | ICD-10-CM

## 2024-04-21 ENCOUNTER — Other Ambulatory Visit: Payer: Self-pay

## 2024-04-21 ENCOUNTER — Emergency Department
Admission: EM | Admit: 2024-04-21 | Discharge: 2024-04-21 | Disposition: A | Discharge status: Home / Self Care | Attending: Emergency Medicine | Admitting: Emergency Medicine

## 2024-04-21 ENCOUNTER — Telehealth (HOSPITAL_COMMUNITY): Payer: Self-pay | Admitting: Pharmacy Technician

## 2024-04-21 ENCOUNTER — Other Ambulatory Visit (HOSPITAL_COMMUNITY): Payer: Self-pay

## 2024-04-21 DIAGNOSIS — R739 Hyperglycemia, unspecified: Secondary | ICD-10-CM

## 2024-04-21 DIAGNOSIS — X58XXXA Exposure to other specified factors, initial encounter: Secondary | ICD-10-CM | POA: Diagnosis not present

## 2024-04-21 DIAGNOSIS — S0091XA Abrasion of unspecified part of head, initial encounter: Secondary | ICD-10-CM | POA: Insufficient documentation

## 2024-04-21 DIAGNOSIS — R569 Unspecified convulsions: Secondary | ICD-10-CM | POA: Diagnosis not present

## 2024-04-21 DIAGNOSIS — Z79899 Other long term (current) drug therapy: Secondary | ICD-10-CM | POA: Diagnosis not present

## 2024-04-21 DIAGNOSIS — S0990XA Unspecified injury of head, initial encounter: Secondary | ICD-10-CM | POA: Diagnosis present

## 2024-04-21 DIAGNOSIS — E1165 Type 2 diabetes mellitus with hyperglycemia: Secondary | ICD-10-CM | POA: Diagnosis not present

## 2024-04-21 LAB — CBC
HCT: 45.5 % (ref 39.0–52.0)
Hemoglobin: 14.9 g/dL (ref 13.0–17.0)
MCH: 26.6 pg (ref 26.0–34.0)
MCHC: 32.7 g/dL (ref 30.0–36.0)
MCV: 81.3 fL (ref 80.0–100.0)
Platelets: 179 K/uL (ref 150–400)
RBC: 5.6 MIL/uL (ref 4.22–5.81)
RDW: 12.3 % (ref 11.5–15.5)
WBC: 6 K/uL (ref 4.0–10.5)
nRBC: 0 % (ref 0.0–0.2)

## 2024-04-21 LAB — BASIC METABOLIC PANEL WITH GFR
Anion gap: 21 — ABNORMAL HIGH (ref 5–15)
BUN: 14 mg/dL (ref 6–20)
CO2: 21 mmol/L — ABNORMAL LOW (ref 22–32)
Calcium: 9.7 mg/dL (ref 8.9–10.3)
Chloride: 99 mmol/L (ref 98–111)
Creatinine, Ser: 1.16 mg/dL (ref 0.61–1.24)
GFR, Estimated: >60 mL/min (ref >60)
Glucose, Bld: 314 mg/dL — ABNORMAL HIGH (ref 70–99)
Potassium: 3.9 mmol/L (ref 3.5–5.1)
Sodium: 141 mmol/L (ref 135–145)

## 2024-04-21 LAB — BLOOD GAS, VENOUS

## 2024-04-21 LAB — BETA-HYDROXYBUTYRIC ACID: Beta-Hydroxybutyric Acid: 0.26 mmol/L (ref 0.05–0.27)

## 2024-04-21 MED ORDER — SODIUM CHLORIDE 0.9 % IV BOLUS
500.0000 mL | Freq: Once | INTRAVENOUS | Status: AC
  Administered 2024-04-21: 500 mL via INTRAVENOUS

## 2024-04-21 MED ORDER — LEVETIRACETAM 750 MG PO TABS
750.0000 mg | ORAL_TABLET | Freq: Once | ORAL | Status: AC
  Administered 2024-04-21: 750 mg via ORAL
  Filled 2024-04-21: qty 1

## 2024-04-21 MED ORDER — LAMOTRIGINE 25 MG PO TABS
50.0000 mg | ORAL_TABLET | Freq: Once | ORAL | Status: AC
  Administered 2024-04-21: 50 mg via ORAL
  Filled 2024-04-21: qty 2

## 2024-04-21 NOTE — Discharge Instructions (Addendum)
 Please call PCP office to find out about referral they were suppose to make to Endocrinology (per telephone note on 01/17/24). If they provide Endocrinology office that the referral was made to, please call the Endocrinology office to ask about making an appointment to establish care and request appointment as soon as you can be seen.

## 2024-04-21 NOTE — Telephone Encounter (Signed)
 Patient Product/process development scientist completed.    The patient is insured through [facility]. Patient has [employer], may use a copay card, and/or apply for patient assistance if available.    Ran test claim for Novolog  Pen and the current 30 day co-pay is $5.00.  Ran test claim for Lantus  Pen and Must Get through Mail order  Ran test claim for Dexcom G7 Sensor and Requires Prior Authorization  Ran test claim for [employer] 3 Plus Sensor and Product Not Covered   This test claim was processed through [employer]- copay amounts may vary at other pharmacies due to [employer], or as the patient moves through the different stages of their insurance plan.     Reyes Sharps, CPHT Pharmacy Technician III Certified Patient Advocate [facility] Patient Advocate Team Direct Number: [phone]  Fax: [phone]

## 2024-04-21 NOTE — ED Provider Notes (Signed)
 [facility] Provider Note    Event Date/Time   First MD Initiated Contact with Patient 04/21/24 [phone]     (approximate)   History   Seizures   HPI  Benjamin Richardson is a 33 y.o. male with a history of seizures, type 2 diabetes who presents after a seizure which occurred today at work.  Patient works overnight.  He forgot to bring his medication to work so missed his dose this morning, his coworkers [name].  He feels well and has no complaints.  Review of records demonstrates patient's all Dr. Lane of neurology on Dec 21, 2023.  Patient is compliant with Lamictal  50 and Keppra  750 twice daily     Physical Exam   Triage Vital Signs: ED Triage Vitals  Encounter Vitals Group     BP 04/21/24 0910 117/69     Girls Systolic BP Percentile --      Girls Diastolic BP Percentile --      Boys Systolic BP Percentile --      Boys Diastolic BP Percentile --      Pulse Rate 04/21/24 0910 (!) 106     Resp 04/21/24 0910 18     Temp 04/21/24 0910 98.5 F (36.9 C)     Temp Source 04/21/24 0910 Oral     SpO2 04/21/24 0910 100 %     Weight 04/21/24 0907 117.9 kg (260 lb)     Height 04/21/24 0907 1.88 m (6' 2)     Head Circumference --      Peak Flow --      Pain Score 04/21/24 0907 0     Pain Loc --      Pain Education --      Exclude from Growth Chart --     Most recent vital signs: Vitals:   04/21/24 0910 04/21/24 1228  BP: 117/69 114/74  Pulse: (!) 106 77  Resp: 18   Temp: 98.5 F (36.9 C)   SpO2: 100% 100%     General: Awake, no distress.  CV:  Good peripheral perfusion.  Resp:  Normal effort.  Abd:  No distention.  Other:  No intraoral lacerations, no urinary incontinence Head: Patient has multiple abrasions, I suspect hematoma underneath one of the braids and there could be a laceration there, I suggested we shave that area to investigate further but the patient has declined this  ED Results / Procedures / Treatments   Labs (all labs  ordered are listed, but only abnormal results are displayed) Labs Reviewed  BASIC METABOLIC PANEL WITH GFR - Abnormal; Notable for the following components:      Result Value   CO2 21 (*)    Glucose, Bld 314 (*)    Anion gap 21 (*)    All other components within normal limits  BLOOD GAS, VENOUS - Abnormal; Notable for the following components:   pO2, Ven <31 (*)    Bicarbonate 28.5 (*)    Acid-Base Excess 3.0 (*)    All other components within normal limits  CBC  BETA-HYDROXYBUTYRIC ACID     EKG     RADIOLOGY     PROCEDURES:  Critical Care performed:   Procedures   MEDICATIONS ORDERED IN ED: Medications  levETIRAcetam  (KEPPRA ) tablet 750 mg (750 mg Oral Given 04/21/24 0944)  lamoTRIgine  (LAMICTAL ) tablet 50 mg (50 mg Oral Given 04/21/24 0925)  sodium chloride  0.9 % bolus 500 mL (0 mLs Intravenous Stopped 04/21/24 1210)  IMPRESSION / MDM / ASSESSMENT AND PLAN / ED COURSE  I reviewed the triage vital signs and the nursing notes. Patient's presentation is most consistent with exacerbation of chronic illness.  Patient presents after seizure, typically well-controlled, last time he had seizure related to elevated glucose/missed dose of medication  Well-appearing here and in no acute distress, will check chemistry, CBC, will give his morning dose of Keppra  and Lamictal  and monitor for further seizure activity.  ----------------------------------------- 10:28 AM on 04/21/2024 ----------------------------------------- Patient is feeling well, no seizure activity in the emergency department, mildly elevated glucose with elevated anion gap, I suspect this is related to his seizure but will check beta hydroxybutyric acid and VBG  ----------------------------------------- 1:10 PM on 04/21/2024 ----------------------------------------- VBG and beta hydroxy are reassuring, not consistent with DKA, seen by diabetes coordinator as well, appropriate discharge at this time       FINAL CLINICAL IMPRESSION(S) / ED DIAGNOSES   Final diagnoses:  Seizure (HCC)  Hyperglycemia     Rx / DC Orders   ED Discharge Orders     None        Note:  This document was prepared using Dragon voice recognition software and may include unintentional dictation errors.   Arlander Charleston, MD 04/21/24 1310

## 2024-04-21 NOTE — ED Triage Notes (Signed)
 Pt from work- had witnessed seizure with hx of seizures. Fell back and hit back of head with laceration. Pt arrives to ED A/O x 4, was postictal when EMS arrived. Pt denies pain. No thinners. Pt can't remember if he took his keppra  this am.

## 2024-04-21 NOTE — Inpatient Diabetes Management (Addendum)
 Inpatient Diabetes Program Recommendations  AACE/ADA: New Consensus Statement on Inpatient Glycemic Control   Target Ranges:  Prepandial:   less than 140 mg/dL      Peak postprandial:   less than 180 mg/dL (1-2 hours)      Critically ill patients:  140 - 180 mg/dL     Latest Reference Range & Units 04/21/24 09:13  Beta-Hydroxybutyric Acid 0.05 - 0.27 mmol/L 0.26  Glucose 70 - 99 mg/dL 685 (H)    Latest Reference Range & Units 10/07/22 09:21 05/10/23 11:14 09/06/23 10:24 01/14/24 11:25  Hemoglobin A1C 4.8 - 5.6 % 14.7 (H) >15.5 (H) 13.7 (H) >15.5 (H)    Latest Reference Range & Units 10/08/20 20:26  C-Peptide 1.1 - 4.4 ng/mL 1.0 (L)    Latest Reference Range & Units 10/08/20 20:26  Glutamic Acid Decarb Ab 0.0 - 5.0 U/mL 114.8 (H)   Review of Glycemic Control  Diabetes history: DM Outpatient Diabetes medications: Lantus  25 units daily, Novolog  10-15 units TID with meals, Metformin  500 mg BID Current orders for Inpatient glycemic control: None; in ED  Inpatient Diabetes Program Recommendations:    Insulin : If patient is admitted, please consider ordering insulin  glargine 35 units daily (based on 117.9 kg x 0.3 units), Novolog  0-9 units AC&HS, and Novolog  6 units TID with meals for meal coverage if patient eats at least 50% of meals.  HbgA1C: Please consider ordering an A1C to evaluate glycemic control over the past 2-3 months. Last A1C was >15.5% on 01/14/24.  Outpatient DM: At time of discharge from ED or inpatient, please provide Rx for CGM sensors  NOTE: Patient in ED from work after having seizure.  Lab glucose 317 mg/dl and last J8R >84.4% on 01/14/24.   In reviewing chart, noted patient was inpatient 10/08/20-10/10/20 for DKA, was newly dx with DM2 at that time and discharged on insulin  regimen.  Spoke with patient and sister at bedside in ED regarding DM. Patient reports he sees his PCP for DM control and he is taking Lantus  25 units daily, Novolog  10-15 units with meals, and  Metformin  500 mg BID. Patient reports that he checks glucose but not as often as he should. Patient reports that he has been trying to make dietary changes to get his DM better controlled.  Patient has not seen an Endocrinologist but per PCP telephone note on 01/17/24 patient was being referred to Endocrinologist.  Patient reports he has not heard anything from PCP office or from an Endocrinology office about the referral. Patient states he has been using a CGM sensor but they kept falling off early and they were $70 per month so he stopped using them. Inquired about knowledge of c-peptide and GAD Ab and patient states that he remembers c-peptide being mentioned but not exactly sure what it was and has not heard anything about the GAD Ab. Explained what a c-peptide is and what GAD Ab are. Discussed c-peptide and GAD-Ab labs from 10/08/20 and explained that he needs to see an Endocrinologist as he may have Type 1 DM.  Discussed difference of Type 1 and Type 2 DM.  Discussed last A1C of >15.5% on 01/14/24 indicating an average glucose of over 398 mg/dl.  Discussed glucose and A1C goals. Discussed importance of checking CBGs and maintaining good CBG control to prevent long-term and short-term complications. Explained how hyperglycemia leads to damage within blood vessels which lead to the common complications seen with uncontrolled diabetes. Stressed to the patient the importance of improving glycemic control to  prevent further complications from uncontrolled diabetes. Asked patient to call PCP office and find out which Endocrinologist a referral was made to and to call that Endocrinology office to see about getting an appointment to establish care as soon as they can see him. Discussed with patient that he likely needs insulin  adjustments to get DM under better control.  Patient is willing to use CGM sensors again. Will have outpatient TOC pharmacy to check and see if FreeStyle Libre 3 or Dexcom G7 sensors are covered and  which may be cheaper copay.  Patient reports that his insulin  copays are affordable. Stressed importance of getting DM under control to prevent further complications especially given he is 33 years old.   Patient verbalized understanding of information discussed and reports no further questions at this time related to diabetes.  Per [facility] pharmacy check, patient's insurance does not cover FreeStyle Libre and Dexcom G7 sensors require prior authorization.  Talked with patient to make aware that FreeStyle Libre sensors are not covered and Dexcom G7 sensors require prior authorization. Discussed Dexcom G7, downloading the app, and how to use the Dexcom G7. Encouraged patient to use QR code to watch video. Patient's mother and brother in law use the Dexcom G7 sensors. Provided Dexcom G7 sensor sample(s) and informed patient he will need his PCP to provide Rx for them and to work with insurance for prior authorization.  Patient appreciative of samples and information discussed.  Thanks, Earnie Gainer, RN, MSN, CDE Diabetes Coordinator Inpatient Diabetes Program [phone] (Team Pager)

## 2024-04-22 LAB — BLOOD GAS, VENOUS
Acid-Base Excess: 3 mmol/L — ABNORMAL HIGH (ref 0.0–2.0)
Bicarbonate: 28.5 mmol/L — ABNORMAL HIGH (ref 20.0–28.0)
O2 Saturation: 41.9 %
Patient temperature: 38
pCO2, Ven: 48 mmHg (ref 44–60)
pH, Ven: 7.39 (ref 7.25–7.43)

## 2024-05-15 ENCOUNTER — Other Ambulatory Visit

## 2024-05-15 DIAGNOSIS — Z1329 Encounter for screening for other suspected endocrine disorder: Secondary | ICD-10-CM

## 2024-05-15 DIAGNOSIS — E1100 Type 2 diabetes mellitus with hyperosmolarity without nonketotic hyperglycemic-hyperosmolar coma (NKHHC): Secondary | ICD-10-CM

## 2024-05-15 DIAGNOSIS — I10 Essential (primary) hypertension: Secondary | ICD-10-CM

## 2024-05-15 DIAGNOSIS — Z13220 Encounter for screening for lipoid disorders: Secondary | ICD-10-CM

## 2024-05-16 ENCOUNTER — Ambulatory Visit: Payer: Self-pay | Admitting: Cardiology

## 2024-05-16 ENCOUNTER — Ambulatory Visit: Admitting: Cardiology

## 2024-05-16 ENCOUNTER — Encounter: Payer: Self-pay | Admitting: Cardiology

## 2024-05-16 VITALS — BP 117/86 | HR 77 | Ht 74.0 in | Wt 236.8 lb

## 2024-05-16 DIAGNOSIS — I10 Essential (primary) hypertension: Secondary | ICD-10-CM | POA: Diagnosis not present

## 2024-05-16 DIAGNOSIS — E1100 Type 2 diabetes mellitus with hyperosmolarity without nonketotic hyperglycemic-hyperosmolar coma (NKHHC): Secondary | ICD-10-CM

## 2024-05-16 DIAGNOSIS — E781 Pure hyperglyceridemia: Secondary | ICD-10-CM

## 2024-05-16 LAB — CMP14+EGFR
ALT: 34 IU/L (ref 0–44)
AST: 20 IU/L (ref 0–40)
Albumin: 4.4 g/dL (ref 4.1–5.1)
Alkaline Phosphatase: 51 IU/L (ref 47–123)
BUN/Creatinine Ratio: 12 (ref 9–20)
BUN: 11 mg/dL (ref 6–20)
Bilirubin Total: 0.4 mg/dL (ref 0.0–1.2)
CO2: 26 mmol/L (ref 20–29)
Calcium: 9.7 mg/dL (ref 8.7–10.2)
Chloride: 102 mmol/L (ref 96–106)
Creatinine, Ser: 0.91 mg/dL (ref 0.76–1.27)
Globulin, Total: 2.2 g/dL (ref 1.5–4.5)
Glucose: 106 mg/dL — ABNORMAL HIGH (ref 70–99)
Potassium: 4.2 mmol/L (ref 3.5–5.2)
Sodium: 140 mmol/L (ref 134–144)
Total Protein: 6.6 g/dL (ref 6.0–8.5)
eGFR: 114 mL/min/1.73 (ref >59)

## 2024-05-16 LAB — POCT UA - MICROALBUMIN
Creatinine, POC: 200 mg/dL
Microalbumin Ur, POC: 150 mg/L

## 2024-05-16 LAB — LIPID PANEL
Chol/HDL Ratio: 2.7 ratio (ref 0.0–5.0)
Cholesterol, Total: 172 mg/dL (ref 100–199)
HDL: 63 mg/dL (ref >39)
LDL Chol Calc (NIH): 100 mg/dL — ABNORMAL HIGH (ref 0–99)
Triglycerides: 46 mg/dL (ref 0–149)
VLDL Cholesterol Cal: 9 mg/dL (ref 5–40)

## 2024-05-16 LAB — TSH: TSH: 1.08 u[IU]/mL (ref 0.450–4.500)

## 2024-05-16 LAB — HEMOGLOBIN A1C
Est. average glucose Bld gHb Est-mCnc: 306 mg/dL
Hgb A1c MFr Bld: 12.3 % — ABNORMAL HIGH (ref 4.8–5.6)

## 2024-05-16 MED ORDER — DEXCOM G7 SENSOR MISC: 6 refills | Status: AC

## 2024-05-16 NOTE — Progress Notes (Signed)
 Established Patient Office Visit  Subjective:  Patient ID: Benjamin Richardson, male    DOB: 29-Oct-1990  Age: 33 y.o. MRN: [id number]  Chief Complaint  Patient presents with   Follow-up    4 month follow up - lab results    Patient in office for 4 month follow up, discuss recent lab results. Patient doing well, no new complaints today.  Patient went to ED on 04/21/24 for a seizure, forgot to take his medication that morning. Patient started on a Dexcom 7 at that time. Blood sugars are better now that patient is more aware. Hgb A1c improved.  Urine micro today. Continue same medications.     No other concerns at this time.   Past Medical History:  Diagnosis Date   DM hyperosmolarity type II, uncontrolled (HCC)    Elevated blood pressure reading without diagnosis of hypertension    Isolated hypertriglyceridemia    Obesity, Class I, BMI 30-34.9    Pre-diabetes 03/28/2015   Seizure (HCC)     History reviewed. No pertinent surgical history.  Social History   Socioeconomic History   Marital status: Single    Spouse name: Not on file   Number of children: Not on file   Years of education: Not on file   Highest education level: Not on file  Occupational History   Not on file  Tobacco Use   Smoking status: Never   Smokeless tobacco: Never  Substance and Sexual Activity   Alcohol use: Yes    Alcohol/week: 0.0 standard drinks of alcohol    Comment: ocassionally   Drug use: No   Sexual activity: Never  Other Topics Concern   Not on file  Social History Narrative   Not on file   Social Drivers of Health   Financial Resource Strain: Not on file  Food Insecurity: Not on file  Transportation Needs: Not on file  Physical Activity: Not on file  Stress: Not on file  Social Connections: Not on file  Intimate Partner Violence: Not on file    Family History  Problem Relation Age of Onset   Diabetes Mother     No Known Allergies  Outpatient Medications Prior to Visit   Medication Sig   insulin  aspart (NOVOLOG  FLEXPEN) 100 UNIT/ML FlexPen Administer a sliding scale insulin  dose based upon your CBG values 3 times a day with meals, according to the following scale: CBG 70-120 = 0 units CBG 121-150 = 1 unit CBG 151-200 = 2 units CBG 201-250 = 3 units CBG 251-300 = 5 units CBG 301-350 = 7 units CBG 351-400 = 9 units CBG > 400 = consult with your doctor   insulin  glargine (LANTUS ) 100 UNIT/ML Solostar Pen Inject 25 Units into the skin daily.   Insulin  Pen Needle 32G X 4 MM MISC 1 Dose by Does not apply route in the morning, at noon, in the evening, and at bedtime.   lamoTRIgine  (LAMICTAL ) 25 MG tablet Take 50 mg by mouth 2 (two) times daily.   levETIRAcetam  (KEPPRA ) 750 MG tablet Take 1 tablet (750 mg total) by mouth 2 (two) times daily.   losartan  (COZAAR ) 25 MG tablet Take 1 tablet (25 mg total) by mouth daily.   metFORMIN  (GLUCOPHAGE ) 500 MG tablet Take 1 tablet (500 mg total) by mouth 2 (two) times daily with a meal.   rosuvastatin (CRESTOR) 40 MG tablet Take 40 mg by mouth daily.   Vitamin D , Ergocalciferol , (DRISDOL ) 1.25 MG (50000 UNIT) CAPS capsule Take 1 capsule (  50,000 Units total) by mouth every 7 (seven) days.   No facility-administered medications prior to visit.    Review of Systems  Constitutional: Negative.   HENT: Negative.    Eyes: Negative.   Respiratory: Negative.  Negative for shortness of breath.   Cardiovascular: Negative.  Negative for chest pain.  Gastrointestinal: Negative.  Negative for abdominal pain, constipation and diarrhea.  Genitourinary: Negative.   Musculoskeletal:  Negative for joint pain and myalgias.  Skin: Negative.   Neurological: Negative.  Negative for dizziness and headaches.  Endo/Heme/Allergies: Negative.   All other systems reviewed and are negative.      Objective:   BP 117/86   Pulse 77   Ht 6' 2 (1.88 m)   Wt 236 lb 12.8 oz (107.4 kg)   SpO2 97%   BMI 30.40 kg/m   Vitals:   05/16/24 1017  BP:  117/86  Pulse: 77  Height: 6' 2 (1.88 m)  Weight: 236 lb 12.8 oz (107.4 kg)  SpO2: 97%  BMI (Calculated): 30.39    Physical Exam Nursing note reviewed.  Constitutional:      Appearance: Normal appearance. He is normal weight.  HENT:     Head: Normocephalic and atraumatic.     Nose: Nose normal.     Mouth/Throat:     Mouth: Mucous membranes are moist.     Pharynx: Oropharynx is clear.  Eyes:     Extraocular Movements: Extraocular movements intact.     Conjunctiva/sclera: Conjunctivae normal.     Pupils: Pupils are equal, round, and reactive to light.  Cardiovascular:     Rate and Rhythm: Normal rate and regular rhythm.     Pulses: Normal pulses.     Heart sounds: Normal heart sounds.  Pulmonary:     Effort: Pulmonary effort is normal.     Breath sounds: Normal breath sounds.  Abdominal:     General: Abdomen is flat. Bowel sounds are normal.     Palpations: Abdomen is soft.  Musculoskeletal:        General: Normal range of motion.     Cervical back: Normal range of motion.  Skin:    General: Skin is warm and dry.  Neurological:     General: No focal deficit present.     Mental Status: He is alert and oriented to person, place, and time.  Psychiatric:        Mood and Affect: Mood normal.        Behavior: Behavior normal.        Thought Content: Thought content normal.        Judgment: Judgment normal.      Results for orders placed or performed in visit on 05/16/24  POCT Urine Albumin/Creatinine with ratio [[id number]]  Result Value Ref Range   Microalbumin Ur, POC 150 mg/L   Creatinine, POC 200 mg/dL   Albumin/Creatinine Ratio, Urine, POC 30-300     Recent Results (from the past 2160 hours)  CBC     Status: None   Collection Time: 04/21/24  9:13 AM  Result Value Ref Range   WBC 6.0 4.0 - 10.5 K/uL   RBC 5.60 4.22 - 5.81 MIL/uL   Hemoglobin 14.9 13.0 - 17.0 g/dL   HCT 54.4 60.9 - 47.9 %   MCV 81.3 80.0 - 100.0 fL   MCH 26.6 26.0 - 34.0 pg   MCHC 32.7  30.0 - 36.0 g/dL   RDW 87.6 88.4 - 84.4 %   Platelets 179 150 - 400  K/uL   nRBC 0.0 0.0 - 0.2 %    Comment: Performed at [facility], [address] [place], [place] [address]  Basic metabolic panel     Status: Abnormal   Collection Time: 04/21/24  9:13 AM  Result Value Ref Range   Sodium 141 135 - 145 mmol/L    Comment: ELECTROLYTES REPEATED TO VERIFY MJU   Potassium 3.9 3.5 - 5.1 mmol/L   Chloride 99 98 - 111 mmol/L   CO2 21 (L) 22 - 32 mmol/L   Glucose, Bld 314 (H) 70 - 99 mg/dL    Comment: Glucose reference range applies only to samples taken after fasting for at least 8 hours.   BUN 14 6 - 20 mg/dL   Creatinine, Ser 8.83 0.61 - 1.24 mg/dL   Calcium 9.7 8.9 - 89.6 mg/dL   GFR, Estimated >39 >39 mL/min    Comment: (NOTE) Calculated using the CKD-EPI Creatinine Equation (2021)    Anion gap 21 (H) 5 - 15    Comment: Performed at [facility], [address] [place], [place] [address]  Beta-hydroxybutyric acid     Status: None   Collection Time: 04/21/24  9:13 AM  Result Value Ref Range   Beta-Hydroxybutyric Acid 0.26 0.05 - 0.27 mmol/L    Comment: Performed at [facility], [address] [place], [place] [address]  Blood gas, venous     Status: Abnormal   Collection Time: 04/21/24 10:48 AM  Result Value Ref Range   pH, Ven 7.39 7.25 - 7.43   pCO2, Ven 48 44 - 60 mmHg   Bicarbonate 28.5 (H) 20.0 - 28.0 mmol/L   Acid-Base Excess 3.0 (H) 0.0 - 2.0 mmol/L   O2 Saturation 41.9 %   Patient temperature 38.0    Collection site VEIN     Comment: Performed at [facility], [address] [place], [place] [address]  TSH     Status: None   Collection Time: 05/15/24  9:37 AM  Result Value Ref Range   TSH 1.080 0.450 - 4.500 uIU/mL  Hemoglobin A1c     Status: Abnormal   Collection Time: 05/15/24  9:37 AM  Result Value Ref Range   Hgb A1c MFr Bld 12.3 (H) 4.8 - 5.6 %    Comment:          Prediabetes: 5.7 - 6.4          Diabetes: >6.4           Glycemic control for adults with diabetes: <7.0    Est. average glucose Bld gHb Est-mCnc 306 mg/dL  Lipid Profile     Status: Abnormal   Collection Time: 05/15/24  9:37 AM  Result Value Ref Range   Cholesterol, Total 172 100 - 199 mg/dL   Triglycerides 46 0 - 149 mg/dL   HDL 63 >60 mg/dL   VLDL Cholesterol Cal 9 5 - 40 mg/dL   LDL Chol Calc (NIH) 899 (H) 0 - 99 mg/dL   Chol/HDL Ratio 2.7 0.0 - 5.0 ratio    Comment:                                   T. Chol/HDL Ratio  Men  Women                               1/2 Avg.Risk  3.4    3.3                                   Avg.Risk  5.0    4.4                                2X Avg.Risk  9.6    7.1                                3X Avg.Risk 23.4   11.0   CMP14+EGFR     Status: Abnormal   Collection Time: 05/15/24  9:37 AM  Result Value Ref Range   Glucose 106 (H) 70 - 99 mg/dL   BUN 11 6 - 20 mg/dL   Creatinine, Ser 9.08 0.76 - 1.27 mg/dL   eGFR 885 >40 fO/fpw/8.26   BUN/Creatinine Ratio 12 9 - 20   Sodium 140 134 - 144 mmol/L   Potassium 4.2 3.5 - 5.2 mmol/L   Chloride 102 96 - 106 mmol/L   CO2 26 20 - 29 mmol/L   Calcium 9.7 8.7 - 10.2 mg/dL   Total Protein 6.6 6.0 - 8.5 g/dL   Albumin 4.4 4.1 - 5.1 g/dL   Globulin, Total 2.2 1.5 - 4.5 g/dL   Bilirubin Total 0.4 0.0 - 1.2 mg/dL   Alkaline Phosphatase 51 47 - 123 IU/L   AST 20 0 - 40 IU/L   ALT 34 0 - 44 IU/L  POCT Urine Albumin/Creatinine with ratio [[id number]]     Status: Abnormal   Collection Time: 05/16/24 11:00 AM  Result Value Ref Range   Microalbumin Ur, POC 150 mg/L   Creatinine, POC 200 mg/dL   Albumin/Creatinine Ratio, Urine, POC 30-300       Assessment & Plan:  Urine micro today Continue same medications  Problem List Items Addressed This Visit       Cardiovascular and Mediastinum   Essential hypertension - Primary     Endocrine   DM hyperosmolarity type II, uncontrolled (HCC)   Relevant Orders   POCT  Urine Albumin/Creatinine with ratio [[id number]] (Completed)     Other   Hypertriglyceridemia    Return in about 4 months (around 09/15/2024) for fasting labs prior.   Total time spent: 25 minutes  [employer], NP  05/16/2024   This document may have been prepared by Dragon Voice Recognition software and as such may include unintentional dictation errors.

## 2024-07-25 ENCOUNTER — Other Ambulatory Visit: Payer: Self-pay | Admitting: Cardiology

## 2024-07-25 DIAGNOSIS — E1100 Type 2 diabetes mellitus with hyperosmolarity without nonketotic hyperglycemic-hyperosmolar coma (NKHHC): Secondary | ICD-10-CM

## 2024-09-18 ENCOUNTER — Ambulatory Visit: Payer: Self-pay | Admitting: Cardiology
